# Patient Record
Sex: Female | Born: 1975 | Race: Black or African American | Hispanic: No | Marital: Single | Smoking: Never smoker
Health system: Southern US, Community
[De-identification: ages and names within clinical notes are randomized; demographics above are authoritative.]

## PROBLEM LIST (undated history)

## (undated) DIAGNOSIS — G43909 Migraine, unspecified, not intractable, without status migrainosus: Secondary | ICD-10-CM

## (undated) HISTORY — PX: TUBAL LIGATION: SHX77

---

## 2020-05-12 ENCOUNTER — Emergency Department (HOSPITAL_COMMUNITY)
Admission: EM | Admit: 2020-05-12 | Discharge: 2020-05-12 | Disposition: A | Discharge status: Home / Self Care | Payer: Medicaid - Out of State | Attending: Emergency Medicine | Admitting: Emergency Medicine

## 2020-05-12 ENCOUNTER — Other Ambulatory Visit: Payer: Self-pay

## 2020-05-12 ENCOUNTER — Encounter (HOSPITAL_COMMUNITY): Payer: Self-pay | Admitting: *Deleted

## 2020-05-12 DIAGNOSIS — M545 Low back pain: Secondary | ICD-10-CM | POA: Diagnosis not present

## 2020-05-12 DIAGNOSIS — M549 Dorsalgia, unspecified: Secondary | ICD-10-CM

## 2020-05-12 HISTORY — DX: Migraine, unspecified, not intractable, without status migrainosus: G43.909

## 2020-05-12 MED ORDER — LIDOCAINE 5 % EX PTCH: 1.0000 | MEDICATED_PATCH | CUTANEOUS | 0 refills | Status: DC

## 2020-05-12 MED ORDER — CELECOXIB 100 MG PO CAPS
100.0000 mg | ORAL_CAPSULE | Freq: Two times a day (BID) | ORAL | 0 refills | Status: AC
Start: 2020-05-12 — End: 2020-05-19

## 2020-05-12 NOTE — ED Triage Notes (Signed)
Pt c/o lower back pain x weeks. Denies known injury. Pt was seen by a doctor and was told it was a pulled muscle and was given muscle relaxers with no relief. Pt ambulatory in triage.

## 2020-05-12 NOTE — ED Provider Notes (Signed)
[facility] EMERGENCY DEPARTMENT Provider Note   CSN: [id number] Arrival date & time: 05/12/20  1258     History Chief Complaint  Patient presents with  . Back Pain    Elizabeth Fox is a 44 y.o. female.  HPI   Patient is a 44 year old female with a history of migraines who presents to the emergency department today complaining of bilateral lower back pain that started a few weeks ago.  States that she does a lot of heavy lifting at work and ended up falling a few weeks ago which also exacerbated her pain.  She has no significant pain at rest but whenever she moves or bends a certain way the pain is worse.  She rates her pain as 10/10.  Pain is improved with a heating pad.  She is also tried muscle relaxers and Flexeril without any significant relief.  She states that she was seen at urgent care recently for this problem and upper respiratory symptoms.  She was told she had a sinus infection and placed on antibiotics.  She was also tested for Covid at that time and it was negative.  Her symptoms persisted so she was seen at another hospital in [place] last week where she had x-rays done and also had a UA completed which were both negative.  She was given a prescription for ibuprofen and muscle relaxers which she states are not improving her symptoms.  She denies any abdominal pain, nausea, vomiting, urinary symptoms.  Denies any chest pain, pleuritic pain.  She has chronic shortness of breath on exertion that is unchanged.  Pt denies any numbness/tingling/weakness to the BLE. Denies saddle anesthesia. Denies loss of control of bowels or bladder. No urinary retention. No fevers. Denies a h/o IVDU. Denies a h/o CA or recent unintended weight loss.  Past Medical History:  Diagnosis Date  . Migraines     There are no problems to display for this patient.   Past Surgical History:  Procedure Laterality Date  . TUBAL LIGATION       OB History   No obstetric history on file.     No  family history on file.  Social History   Tobacco Use  . Smoking status: Never Smoker  . Smokeless tobacco: Never Used  Substance Use Topics  . Alcohol use: Never  . Drug use: Never    Home Medications Prior to Admission medications   Medication Sig Start Date End Date Taking? Authorizing Provider  celecoxib (CELEBREX) 100 MG capsule Take 1 capsule (100 mg total) by mouth 2 (two) times daily for 7 days. 05/12/20 05/19/20  Sofie Schendel S, PA-C  lidocaine (LIDODERM) 5 % Place 1 patch onto the skin daily. Remove & Discard patch within 12 hours or as directed by MD 05/12/20   Artisha Capri, [place], PA-C    Allergies    Patient has no known allergies.  Review of Systems   Review of Systems  Constitutional: Negative for fever.  Respiratory: Negative for shortness of breath.   Cardiovascular: Negative for chest pain.  Gastrointestinal: Negative for abdominal pain, constipation, diarrhea, nausea and vomiting.  Genitourinary: Negative for dysuria and hematuria.       No loss of control of bowel or bladder function  Musculoskeletal: Positive for back pain.  Skin: Negative for rash.  Neurological: Negative for weakness and numbness.  All other systems reviewed and are negative.   Physical Exam Updated Vital Signs BP 120/71 (BP Location: Right Arm)   Pulse 87  Temp 99.1 F (37.3 C) (Oral)   Ht 5\' 6"  (1.676 m)   Wt 90.3 kg   SpO2 100%   BMI 32.12 kg/m   Physical Exam Vitals and nursing note reviewed.  Constitutional:      General: She is not in acute distress.    Appearance: She is well-developed.  HENT:     Head: Normocephalic and atraumatic.  Eyes:     Conjunctiva/sclera: Conjunctivae normal.  Cardiovascular:     Rate and Rhythm: Normal rate and regular rhythm.     Heart sounds: Normal heart sounds. No murmur.  Pulmonary:     Effort: Pulmonary effort is normal. No respiratory distress.     Breath sounds: Normal breath sounds. No wheezing, rhonchi or rales.  Abdominal:      Palpations: Abdomen is soft.     Tenderness: There is no abdominal tenderness. There is no right CVA tenderness or left CVA tenderness.  Musculoskeletal:     Cervical back: Neck supple.     Comments: TTP noted to the midline lumbar spine as well as to the bilateral lumbar paraspinous muscles that reproduces her pain.  She had 5/5 strength noted to the bilateral lower extremities with normal sensation throughout.  Ambulatory with a slight limp but steady gait.  Skin:    General: Skin is warm and dry.  Neurological:     Mental Status: She is alert.     ED Results / Procedures / Treatments   Labs (all labs ordered are listed, but only abnormal results are displayed) Labs Reviewed - No data to display  EKG None  Radiology No results found.  Procedures Procedures (including critical care time)  Medications Ordered in ED Medications - No data to display  ED Course  I have reviewed the triage vital signs and the nursing notes.  Pertinent labs & imaging results that were available during my care of the patient were reviewed by me and considered in my medical decision making (see chart for details).    MDM Rules/Calculators/A&P                      Patient with back pain.  No neurological deficits and normal neuro exam.  Patient can walk but states is painful.  No loss of bowel or bladder control.  No concern for cauda equina.  No fever, night sweats, weight loss, h/o cancer, IVDU.  RICE protocol and pain medicine indicated and discussed with patient. F/u given. Advised on return precautions. She voices understanding of the plan and reasons to return. All questions answered, pt stable for discharge.    Final Clinical Impression(s) / ED Diagnoses Final diagnoses:  Acute back pain, unspecified back location, unspecified back pain laterality    Rx / DC Orders ED Discharge Orders         Ordered    celecoxib (CELEBREX) 100 MG capsule  2 times daily     05/12/20 1351     lidocaine (LIDODERM) 5 %  Every 24 hours     05/12/20 1351           Rehan Holness S, PA-C 05/12/20 1351    05/14/20, MD 05/12/20 2111

## 2020-05-12 NOTE — Discharge Instructions (Signed)
Take celebrex as directed.   Do not take other NSAIDs while taking Celebrex such as (Aleve, Naprosyn, Aspirin, Ibuprofen, etc) and do not take more than the prescribed dose as this can lead to ulcers and bleeding in your GI tract. You may use warm and cold compresses to help with your symptoms.   You were also given a prescription for Lidoderm patches.  Please use as directed.  Please follow up with your primary doctor within the next 7-10 days for re-evaluation and further treatment of your symptoms.  Return to the emergency department immediately if you experience any back pain associated with fevers, loss of control of your bowels/bladder, weakness/numbness to your legs, numbness to your groin area, inability to walk, or inability to urinate.

## 2020-08-18 ENCOUNTER — Other Ambulatory Visit: Payer: Self-pay

## 2020-08-18 ENCOUNTER — Emergency Department (HOSPITAL_COMMUNITY)
Admission: EM | Admit: 2020-08-18 | Discharge: 2020-08-18 | Disposition: A | Discharge status: Home / Self Care | Payer: BC Managed Care – PPO | Attending: Emergency Medicine | Admitting: Emergency Medicine

## 2020-08-18 ENCOUNTER — Emergency Department (HOSPITAL_COMMUNITY): Payer: BC Managed Care – PPO

## 2020-08-18 ENCOUNTER — Encounter (HOSPITAL_COMMUNITY): Payer: Self-pay | Admitting: *Deleted

## 2020-08-18 DIAGNOSIS — R1030 Lower abdominal pain, unspecified: Secondary | ICD-10-CM | POA: Diagnosis present

## 2020-08-18 DIAGNOSIS — R112 Nausea with vomiting, unspecified: Secondary | ICD-10-CM | POA: Diagnosis not present

## 2020-08-18 LAB — CBC WITH DIFFERENTIAL/PLATELET
Abs Immature Granulocytes: 0.02 10*3/uL (ref 0.00–0.07)
Basophils Absolute: 0 10*3/uL (ref 0.0–0.1)
Basophils Relative: 1 %
Eosinophils Absolute: 0 10*3/uL (ref 0.0–0.5)
Eosinophils Relative: 0 %
HCT: 45 % (ref 36.0–46.0)
Hemoglobin: 14.8 g/dL (ref 12.0–15.0)
Immature Granulocytes: 0 %
Lymphocytes Relative: 31 %
Lymphs Abs: 2.7 10*3/uL (ref 0.7–4.0)
MCH: 30 pg (ref 26.0–34.0)
MCHC: 32.9 g/dL (ref 30.0–36.0)
MCV: 91.3 fL (ref 80.0–100.0)
Monocytes Absolute: 0.4 10*3/uL (ref 0.1–1.0)
Monocytes Relative: 5 %
Neutro Abs: 5.3 10*3/uL (ref 1.7–7.7)
Neutrophils Relative %: 63 %
Platelets: 251 10*3/uL (ref 150–400)
RBC: 4.93 MIL/uL (ref 3.87–5.11)
RDW: 13 % (ref 11.5–15.5)
WBC: 8.5 10*3/uL (ref 4.0–10.5)
nRBC: 0 % (ref 0.0–0.2)

## 2020-08-18 LAB — COMPREHENSIVE METABOLIC PANEL
ALT: 19 U/L (ref 0–44)
AST: 15 U/L (ref 15–41)
Albumin: 4.2 g/dL (ref 3.5–5.0)
Alkaline Phosphatase: 95 U/L (ref 38–126)
Anion gap: 11 (ref 5–15)
BUN: 12 mg/dL (ref 6–20)
CO2: 22 mmol/L (ref 22–32)
Calcium: 8.9 mg/dL (ref 8.9–10.3)
Chloride: 107 mmol/L (ref 98–111)
Creatinine, Ser: 0.73 mg/dL (ref 0.44–1.00)
GFR calc Af Amer: >60 mL/min (ref >60)
GFR calc non Af Amer: >60 mL/min (ref >60)
Glucose, Bld: 136 mg/dL — ABNORMAL HIGH (ref 70–99)
Potassium: 3.3 mmol/L — ABNORMAL LOW (ref 3.5–5.1)
Sodium: 140 mmol/L (ref 135–145)
Total Bilirubin: 0.5 mg/dL (ref 0.3–1.2)
Total Protein: 7.6 g/dL (ref 6.5–8.1)

## 2020-08-18 LAB — URINALYSIS, ROUTINE W REFLEX MICROSCOPIC
Bacteria, UA: NONE SEEN
Bilirubin Urine: NEGATIVE
Glucose, UA: NEGATIVE mg/dL
Ketones, ur: NEGATIVE mg/dL
Leukocytes,Ua: NEGATIVE
Nitrite: NEGATIVE
Protein, ur: NEGATIVE mg/dL
Specific Gravity, Urine: 1.023 (ref 1.005–1.030)
pH: 5 (ref 5.0–8.0)

## 2020-08-18 LAB — HCG, QUANTITATIVE, PREGNANCY: hCG, Beta Chain, Quant, S: <1 m[IU]/mL (ref <5)

## 2020-08-18 LAB — LIPASE, BLOOD: Lipase: 27 U/L (ref 11–51)

## 2020-08-18 MED ORDER — DICYCLOMINE HCL 20 MG PO TABS: 20.0000 mg | ORAL_TABLET | Freq: Two times a day (BID) | ORAL | 0 refills | Status: DC

## 2020-08-18 MED ORDER — ONDANSETRON 4 MG PO TBDP
4.0000 mg | ORAL_TABLET | Freq: Three times a day (TID) | ORAL | 0 refills | Status: AC | PRN
Start: 2020-08-18 — End: ?

## 2020-08-18 MED ORDER — IOHEXOL 300 MG/ML  SOLN
100.0000 mL | Freq: Once | INTRAMUSCULAR | Status: AC | PRN
  Administered 2020-08-18: 100 mL via INTRAVENOUS

## 2020-08-18 NOTE — ED Triage Notes (Signed)
Pt c/o lower abd pain that radiates to back area for the past month, admits to n/v with the pain along with constipation.

## 2020-08-18 NOTE — ED Provider Notes (Signed)
Elizabeth Fox   CSN: [id number] Arrival date & time: 08/18/20  1304     History Chief Complaint  Patient presents with  . Abdominal Pain    Elizabeth Fox is a 44 y.o. female presenting for evaluation of intermittent lower abdominal pain which radiates to her lower back which is been present since the beginning of July since she was evaluated for a suspected viral gastritis.  Since this time she has had increasing problems with constipation but also pain symptoms associated with nausea with occasional emesis.  She has seen her PCP for this and it was suspected that she had possible IBS.  She has been on Linzess which has not improved her symptoms.  She describes sharp sometimes cramping pain randomly occurring across her lower abdomen, sometimes fleeting, sometimes more long-lasting.  She is currently symptom-free.  She has had no fevers or chills, denies dysuria, vaginal discharge or pain.  No hematuria.  The history is provided by the patient.       Past Medical History:  Diagnosis Date  . Migraines     There are no problems to display for this patient.   Past Surgical History:  Procedure Laterality Date  . TUBAL LIGATION       OB History   No obstetric history on file.     No family history on file.  Social History   Tobacco Use  . Smoking status: Never Smoker  . Smokeless tobacco: Never Used  Vaping Use  . Vaping Use: Never used  Substance Use Topics  . Alcohol use: Never  . Drug use: Never    Home Medications Prior to Admission medications   Medication Sig Start Date End Date Taking? Authorizing Provider  dicyclomine (BENTYL) 20 MG tablet Take 1 tablet (20 mg total) by mouth 2 (two) times daily. 08/18/20   Burgess Amor, PA-C  lidocaine (LIDODERM) 5 % Place 1 patch onto the skin daily. Remove & Discard patch within 12 hours or as directed by MD 05/12/20   Couture, Cortni S, PA-C  ondansetron (ZOFRAN ODT) 4 MG disintegrating  tablet Take 1 tablet (4 mg total) by mouth every 8 (eight) hours as needed for nausea or vomiting. 08/18/20   Burgess Amor, PA-C    Allergies    Patient has no known allergies.  Review of Systems   Review of Systems  Constitutional: Negative for chills and fever.  HENT: Negative for congestion and sore throat.   Eyes: Negative.   Respiratory: Negative for chest tightness and shortness of breath.   Cardiovascular: Negative for chest pain.  Gastrointestinal: Positive for abdominal pain and constipation. Negative for diarrhea, nausea and vomiting.  Genitourinary: Negative.  Negative for dysuria, hematuria, vaginal discharge and vaginal pain.  Musculoskeletal: Negative for arthralgias, joint swelling and neck pain.  Skin: Negative.  Negative for rash and wound.  Neurological: Negative for dizziness, weakness, light-headedness, numbness and headaches.  Psychiatric/Behavioral: Negative.     Physical Exam Updated Vital Signs BP 132/78 (BP Location: Right Arm)   Pulse 64   Temp 98.5 F (36.9 C) (Oral)   Resp 17   Ht 5\' 7"  (1.702 m)   Wt 89.8 kg   SpO2 100%   BMI 31.01 kg/m   Physical Exam Vitals and nursing Fox reviewed.  Constitutional:      Appearance: She is well-developed.  HENT:     Head: Normocephalic and atraumatic.  Eyes:     Conjunctiva/sclera: Conjunctivae normal.  Cardiovascular:  Rate and Rhythm: Normal rate and regular rhythm.     Heart sounds: Normal heart sounds.  Pulmonary:     Effort: Pulmonary effort is normal.     Breath sounds: Normal breath sounds. No wheezing.  Abdominal:     General: Bowel sounds are normal. There is distension.     Palpations: Abdomen is soft.     Tenderness: There is abdominal tenderness in the suprapubic area.     Hernia: No hernia is present.     Comments: Increased tympany lower abdomen.    Musculoskeletal:        General: Normal range of motion.     Cervical back: Normal range of motion.  Skin:    General: Skin is warm  and dry.  Neurological:     Mental Status: She is alert.     ED Results / Procedures / Treatments   Labs (all labs ordered are listed, but only abnormal results are displayed) Labs Reviewed  COMPREHENSIVE METABOLIC PANEL - Abnormal; Notable for the following components:      Result Value   Potassium 3.3 (*)    Glucose, Bld 136 (*)    All other components within normal limits  URINALYSIS, ROUTINE W REFLEX MICROSCOPIC - Abnormal; Notable for the following components:   Hgb urine dipstick SMALL (*)    All other components within normal limits  CBC WITH DIFFERENTIAL/PLATELET  LIPASE, BLOOD  HCG, QUANTITATIVE, PREGNANCY    EKG None  Radiology CT ABDOMEN PELVIS W CONTRAST  Result Date: 08/18/2020 CLINICAL DATA:  Abdominal distension and pain for several weeks, initial encounter EXAM: CT ABDOMEN AND PELVIS WITH CONTRAST TECHNIQUE: Multidetector CT imaging of the abdomen and pelvis was performed using the standard protocol following bolus administration of intravenous contrast. CONTRAST:  OMNIPAQUE IOHEXOL 300 MG/ML  SOLN COMPARISON:  None. FINDINGS: Lower chest: No acute abnormality. Hepatobiliary: No focal liver abnormality is seen. No gallstones, gallbladder wall thickening, or biliary dilatation. Pancreas: Unremarkable. No pancreatic ductal dilatation or surrounding inflammatory changes. Spleen: Normal in size without focal abnormality. Adrenals/Urinary Tract: The adrenal glands are within normal limits. Kidneys are well visualized bilaterally. Normal enhancement pattern is seen. Normal excretion of contrast is noted bilaterally. No obstructive changes are noted. No renal calculi are seen. The bladder is partially distended. Stomach/Bowel: The appendix is within normal limits. No obstructive or inflammatory changes of colon are seen. Small bowel and stomach appear within normal limits. Vascular/Lymphatic: No significant vascular findings are present. No enlarged abdominal or pelvic  lymph nodes. Reproductive: Uterus and bilateral adnexa are unremarkable. Other: No abdominal wall hernia or abnormality. No abdominopelvic ascites. Musculoskeletal: Degenerative changes of lumbar spine are noted. IMPRESSION: No acute abnormality noted. Electronically Signed   By: Alcide Clever M.D.   On: 08/18/2020 18:38    Procedures Procedures (including critical care time)  Medications Ordered in ED Medications  iohexol (OMNIPAQUE) 300 MG/ML solution 100 mL (100 mLs Intravenous Contrast Given 08/18/20 1819)    ED Course  I have reviewed the triage vital signs and the nursing notes.  Pertinent labs & imaging results that were available during my care of the patient were reviewed by me and considered in my medical decision making (see chart for details).    MDM Rules/Calculators/A&P                          Pt with intermittent now chronic lower abdominal pain, some constipation noted not improved with Linzess. Labs and  imaging reviewed and discussed. Reassurance given.  She was advised to add miralax to her daily routine for constipation improvement, may consider holding the Linzess since she had no improvement with this.  zofran prescribed,  Also prescribed bentyl for cramping but urged use sparingly as can increase constipation.  Referral to GI for f/u care prn.  The patient appears reasonably screened and/or stabilized for discharge and I doubt any other medical condition or other [facility] requiring further screening, evaluation, or treatment in the ED at this time prior to discharge.  Final Clinical Impression(s) / ED Diagnoses Final diagnoses:  Lower abdominal pain  Non-intractable vomiting with nausea, unspecified vomiting type    Rx / DC Orders ED Discharge Orders         Ordered    ondansetron (ZOFRAN ODT) 4 MG disintegrating tablet  Every 8 hours PRN        08/18/20 1941    dicyclomine (BENTYL) 20 MG tablet  2 times daily        08/18/20 1941           Burgess Amor,  Cordelia Poche 08/19/20 1307    Derwood Kaplan, MD 08/19/20 1555

## 2020-08-18 NOTE — Discharge Instructions (Addendum)
Your lab tests and CT imaging today are negative for any abnormalities which would explain the source of your symptoms.  You may need further testing by a gastroenterologist as discussed.  See the referral above.  In the interim you may try the medications prescribed for nausea and abdominal pain.

## 2020-08-23 ENCOUNTER — Other Ambulatory Visit: Payer: Self-pay

## 2020-08-23 ENCOUNTER — Encounter (INDEPENDENT_AMBULATORY_CARE_PROVIDER_SITE_OTHER): Payer: Self-pay | Admitting: *Deleted

## 2020-08-23 ENCOUNTER — Other Ambulatory Visit (INDEPENDENT_AMBULATORY_CARE_PROVIDER_SITE_OTHER): Payer: Self-pay | Admitting: *Deleted

## 2020-08-23 ENCOUNTER — Encounter (INDEPENDENT_AMBULATORY_CARE_PROVIDER_SITE_OTHER): Payer: Self-pay | Admitting: Gastroenterology

## 2020-08-23 ENCOUNTER — Ambulatory Visit (INDEPENDENT_AMBULATORY_CARE_PROVIDER_SITE_OTHER): Payer: BC Managed Care – PPO | Admitting: Gastroenterology

## 2020-08-23 VITALS — BP 115/79 | HR 74 | Temp 99.1°F | Ht 67.0 in | Wt 199.5 lb

## 2020-08-23 DIAGNOSIS — R1084 Generalized abdominal pain: Secondary | ICD-10-CM

## 2020-08-23 DIAGNOSIS — R110 Nausea: Secondary | ICD-10-CM

## 2020-08-23 DIAGNOSIS — K5909 Other constipation: Secondary | ICD-10-CM

## 2020-08-23 MED ORDER — PANTOPRAZOLE SODIUM 40 MG PO TBEC
40.0000 mg | DELAYED_RELEASE_TABLET | Freq: Two times a day (BID) | ORAL | 3 refills | Status: DC
Start: 2020-08-23 — End: 2020-09-08

## 2020-08-23 MED ORDER — LINACLOTIDE 290 MCG PO CAPS: 290.0000 ug | ORAL_CAPSULE | Freq: Every day | ORAL | 3 refills | Status: DC

## 2020-08-23 NOTE — H&P (View-Only) (Signed)
Patient profile: Elizabeth Fox is a 44 y.o. female seen for evaluation of ER f/up (seen at [facility] 08/18/20 and [facility] 08/11/20).  History of Present Illness: Elizabeth Fox is seen today for ER follow-up, she was seen for evaluation of lower abdominal pain and constipation.  She is seen today for 6 weeks of severe abdominal pain.  She has been in the emergency room twice, once [facility] and once at [facility].  Reports prior to 6 weeks ago having chronic constipation that was fairly well controlled with Linzess (unsure of dose).  She more recently over the past 6 weeks describes diffuse abdominal pain on over her entire abdomen radiating into her back.  She does not feel that it changes significantly with eating.  It is associated with nausea and early satiety.  Feels she is eating a small amount and feels full.  Occasional vomiting and headaches.  She denies any dysphagia.  She was put on Pepcid at one ER visit without significant improvement.  She feels that the Linzess she is on is no longer working but is unsure of the dose, she is typically having 3 to 4 days between bowel movements.  She denies any alternating diarrhea or blood in stool. States her abdominal pain eases a little bit after bowel movement but does not completely resolve.  She has been given dicyclomine without improvement after ER visit.  She also reports trying some hydrocodone without improvement as well.   She reports at [facility] she was also treated for possible sinus infection with antibiotics that did not change her GI symptoms.  She is a non-smoker, no alcohol.  Denies frequent NSAID use.  Wt Readings from Last 3 Encounters:  08/23/20 199 lb 8 oz (90.5 kg)  08/18/20 198 lb (89.8 kg)  05/12/20 199 lb (90.3 kg)     Last Colonoscopy: none prior Last Endoscopy: none prior    Past Medical History:  Past Medical History:  Diagnosis Date  . Migraines     Problem List: There are no problems to display for this patient.    Past Surgical History: Past Surgical History:  Procedure Laterality Date  . TUBAL LIGATION      Allergies: No Known Allergies    Home Medications:  Current Outpatient Medications:  .  ALPRAZolam (XANAX) 1 MG tablet, Take by mouth., Disp: , Rfl:  .  dicyclomine (BENTYL) 20 MG tablet, Take 1 tablet (20 mg total) by mouth 2 (two) times daily., Disp: 20 tablet, Rfl: 0 .  ondansetron (ZOFRAN ODT) 4 MG disintegrating tablet, Take 1 tablet (4 mg total) by mouth every 8 (eight) hours as needed for nausea or vomiting., Disp: 20 tablet, Rfl: 0 .  lidocaine (LIDODERM) 5 %, Place 1 patch onto the skin daily. Remove & Discard patch within 12 hours or as directed by MD (Patient not taking: Reported on 08/23/2020), Disp: 30 patch, Rfl: 0 .  linaclotide (LINZESS) 290 MCG CAPS capsule, Take 1 capsule (290 mcg total) by mouth daily before breakfast., Disp: 30 capsule, Rfl: 3 .  pantoprazole (PROTONIX) 40 MG tablet, Take 1 tablet (40 mg total) by mouth 2 (two) times daily before a meal., Disp: 60 tablet, Rfl: 3   Family History: family history includes Diabetes in her mother.  Denies any family GI history of colon polyps colon cancer.  Social History:    reports that she has never smoked. She has never used smokeless tobacco. She reports that she does not drink alcohol and does not use drugs.  Review of Systems: Constitutional: Denies weight loss/weight gain  Eyes: No changes in vision. ENT: No oral lesions, sore throat.  GI: see HPI.  Heme/Lymph: No easy bruising.  CV: No chest pain.  GU: No hematuria.  Integumentary: No rashes.  Neuro: No headaches.  Psych: No depression/anxiety.  Endocrine: No heat/cold intolerance.  Allergic/Immunologic: No urticaria.  Resp: No cough, SOB.  Musculoskeletal: No joint swelling.    Physical Examination: BP 115/79 (BP Location: Left Arm, Patient Position: Sitting, Cuff Size: Normal)   Pulse 74   Temp 99.1 F (37.3 C) (Oral)   Ht 5\' 7"  (1.702 m)   Wt  199 lb 8 oz (90.5 kg)   BMI 31.25 kg/m  Gen: NAD, alert and oriented x 4 HEENT: PEERLA, EOMI, Neck: supple, no JVD Chest: CTA bilaterally, no wheezes, crackles, or other adventitious sounds CV: RRR, no m/g/c/r Abd: soft, diffuse abdominal tenderness worse in epigastric area, ND, +BS in all four quadrants; no HSM, guarding, ridigity, or rebound tenderness Ext: no edema, well perfused with 2+ pulses, Skin: no rash or lesions noted on observed skin Lymph: no noted LAD  Data:  08/18/20-CT a/p IMPRESSION: No acute abnormality noted.  August 16, 2020-CBC unremarkable, CMP with potassium 3.3, Lipase normal.  COVID negative 08/11/20   Assessment/Plan: Ms. Fox is a 44 y.o. female  Elizabeth was seen today for new patient (initial visit).  Diagnoses and all orders for this visit:  Nausea without vomiting  Diffuse abdominal pain  Chronic constipation  Other orders -     pantoprazole (PROTONIX) 40 MG tablet; Take 1 tablet (40 mg total) by mouth 2 (two) times daily before a meal. -     linaclotide (LINZESS) 290 MCG CAPS capsule; Take 1 capsule (290 mcg total) by mouth daily before breakfast.     1.  Epigastric pain/nausea-associated with nausea and early satiety despite Pepcid.  She had a normal CT scan and labs except from mild hypokalemia.  We will start her on a PPI in case gastritis or peptic ulcer disease is contributing.  We will schedule endoscopy for evaluation.  Has Zofran to use as needed for nausea  2.  Diffuse abdominal pain associated with constipation-on unknown dose of Linzess prior (possibly 55), will try 290 mcg.  Adequate fluid encouraged.  We will schedule colonoscopy for evaluation which she has never had in the past.  She denies any improvement with dicyclomine or Lortab.   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 Completed to limit physical activity at  work while ongoing work-up.  Patient denies CP, SOB, and use of blood thinners. I discussed the risks and benefits of procedure including bleeding, perforation, infection, missed lesions, medication reactions and possible hospitalization or surgery if complications. All questions answered.     I personally performed the service, non-incident to. (WP)  , [facility]

## 2020-08-23 NOTE — Patient Instructions (Addendum)
Limit fatty greasy foods.  We are starting Protonix for treatment of a possible stomach ulcer.  We are scheduling endoscopy colonoscopy for evaluation.

## 2020-08-23 NOTE — Telephone Encounter (Signed)
This encounter was created in error - please disregard.

## 2020-08-23 NOTE — Progress Notes (Signed)
 Patient profile: Elizabeth Fox is a 43 y.o. female seen for evaluation of ER f/up (seen at [facility] 08/18/20 and [facility] 08/11/20).  History of Present Illness: Elizabeth Fox is seen today for ER follow-up, she was seen for evaluation of lower abdominal pain and constipation.  She is seen today for 6 weeks of severe abdominal pain.  She has been in the emergency room twice, once [facility] and once at [facility].  Reports prior to 6 weeks ago having chronic constipation that was fairly well controlled with Linzess (unsure of dose).  She more recently over the past 6 weeks describes diffuse abdominal pain on over her entire abdomen radiating into her back.  She does not feel that it changes significantly with eating.  It is associated with nausea and early satiety.  Feels she is eating a small amount and feels full.  Occasional vomiting and headaches.  She denies any dysphagia.  She was put on Pepcid at one ER visit without significant improvement.  She feels that the Linzess she is on is no longer working but is unsure of the dose, she is typically having 3 to 4 days between bowel movements.  She denies any alternating diarrhea or blood in stool. States her abdominal pain eases a little bit after bowel movement but does not completely resolve.  She has been given dicyclomine without improvement after ER visit.  She also reports trying some hydrocodone without improvement as well.   She reports at [facility] she was also treated for possible sinus infection with antibiotics that did not change her GI symptoms.  She is a non-smoker, no alcohol.  Denies frequent NSAID use.  Wt Readings from Last 3 Encounters:  08/23/20 199 lb 8 oz (90.5 kg)  08/18/20 198 lb (89.8 kg)  05/12/20 199 lb (90.3 kg)     Last Colonoscopy: none prior Last Endoscopy: none prior    Past Medical History:  Past Medical History:  Diagnosis Date  . Migraines     Problem List: There are no problems to display for this patient.    Past Surgical History: Past Surgical History:  Procedure Laterality Date  . TUBAL LIGATION      Allergies: No Known Allergies    Home Medications:  Current Outpatient Medications:  .  ALPRAZolam (XANAX) 1 MG tablet, Take by mouth., Disp: , Rfl:  .  dicyclomine (BENTYL) 20 MG tablet, Take 1 tablet (20 mg total) by mouth 2 (two) times daily., Disp: 20 tablet, Rfl: 0 .  ondansetron (ZOFRAN ODT) 4 MG disintegrating tablet, Take 1 tablet (4 mg total) by mouth every 8 (eight) hours as needed for nausea or vomiting., Disp: 20 tablet, Rfl: 0 .  lidocaine (LIDODERM) 5 %, Place 1 patch onto the skin daily. Remove & Discard patch within 12 hours or as directed by MD (Patient not taking: Reported on 08/23/2020), Disp: 30 patch, Rfl: 0 .  linaclotide (LINZESS) 290 MCG CAPS capsule, Take 1 capsule (290 mcg total) by mouth daily before breakfast., Disp: 30 capsule, Rfl: 3 .  pantoprazole (PROTONIX) 40 MG tablet, Take 1 tablet (40 mg total) by mouth 2 (two) times daily before a meal., Disp: 60 tablet, Rfl: 3   Family History: family history includes Diabetes in her mother.  Denies any family GI history of colon polyps colon cancer.  Social History:    reports that she has never smoked. She has never used smokeless tobacco. She reports that she does not drink alcohol and does not use drugs.     Review of Systems: Constitutional: Denies weight loss/weight gain  Eyes: No changes in vision. ENT: No oral lesions, sore throat.  GI: see HPI.  Heme/Lymph: No easy bruising.  CV: No chest pain.  GU: No hematuria.  Integumentary: No rashes.  Neuro: No headaches.  Psych: No depression/anxiety.  Endocrine: No heat/cold intolerance.  Allergic/Immunologic: No urticaria.  Resp: No cough, SOB.  Musculoskeletal: No joint swelling.    Physical Examination: BP 115/79 (BP Location: Left Arm, Patient Position: Sitting, Cuff Size: Normal)   Pulse 74   Temp 99.1 F (37.3 C) (Oral)   Ht 5\' 7"  (1.702 m)   Wt  199 lb 8 oz (90.5 kg)   BMI 31.25 kg/m  Gen: NAD, alert and oriented x 4 HEENT: PEERLA, EOMI, Neck: supple, no JVD Chest: CTA bilaterally, no wheezes, crackles, or other adventitious sounds CV: RRR, no m/g/c/r Abd: soft, diffuse abdominal tenderness worse in epigastric area, ND, +BS in all four quadrants; no HSM, guarding, ridigity, or rebound tenderness Ext: no edema, well perfused with 2+ pulses, Skin: no rash or lesions noted on observed skin Lymph: no noted LAD  Data:  08/18/20-CT a/p IMPRESSION: No acute abnormality noted.  August 16, 2020-CBC unremarkable, CMP with potassium 3.3, Lipase normal.  COVID negative 08/11/20   Assessment/Plan: [name] is a 44 y.o. female  [name] was seen today for new patient (initial visit).  Diagnoses and all orders for this visit:  Nausea without vomiting  Diffuse abdominal pain  Chronic constipation  Other orders -     pantoprazole (PROTONIX) 40 MG tablet; Take 1 tablet (40 mg total) by mouth 2 (two) times daily before a meal. -     linaclotide (LINZESS) 290 MCG CAPS capsule; Take 1 capsule (290 mcg total) by mouth daily before breakfast.     1.  Epigastric pain/nausea-associated with nausea and early satiety despite Pepcid.  She had a normal CT scan and labs except from mild hypokalemia.  We will start her on a PPI in case gastritis or peptic ulcer disease is contributing.  We will schedule endoscopy for evaluation.  Has Zofran to use as needed for nausea  2.  Diffuse abdominal pain associated with constipation-on unknown dose of Linzess prior (possibly 55), will try 290 mcg.  Adequate fluid encouraged.  We will schedule colonoscopy for evaluation which she has never had in the past.  She denies any improvement with dicyclomine or Lortab.   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 Completed to limit physical activity at  work while ongoing work-up.  Patient denies CP, SOB, and use of blood thinners. I discussed the risks and benefits of procedure including bleeding, perforation, infection, missed lesions, medication reactions and possible hospitalization or surgery if complications. All questions answered.     I personally performed the service, non-incident to. (WP)  , [facility]

## 2020-08-27 ENCOUNTER — Other Ambulatory Visit (HOSPITAL_COMMUNITY)
Admission: RE | Admit: 2020-08-27 | Discharge: 2020-08-27 | Disposition: A | Discharge status: Home / Self Care | Payer: BC Managed Care – PPO | Source: Ambulatory Visit | Attending: Gastroenterology | Admitting: Gastroenterology

## 2020-08-27 ENCOUNTER — Other Ambulatory Visit: Payer: Self-pay

## 2020-08-27 DIAGNOSIS — Z20822 Contact with and (suspected) exposure to covid-19: Secondary | ICD-10-CM | POA: Diagnosis not present

## 2020-08-27 DIAGNOSIS — Z01812 Encounter for preprocedural laboratory examination: Secondary | ICD-10-CM | POA: Insufficient documentation

## 2020-08-28 LAB — SARS CORONAVIRUS 2 (TAT 6-24 HRS): SARS Coronavirus 2: NEGATIVE

## 2020-09-01 ENCOUNTER — Ambulatory Visit (HOSPITAL_COMMUNITY)
Admission: RE | Admit: 2020-09-01 | Discharge: 2020-09-01 | Disposition: A | Discharge status: Home / Self Care | Payer: BC Managed Care – PPO | Attending: Gastroenterology | Admitting: Gastroenterology

## 2020-09-01 ENCOUNTER — Ambulatory Visit (HOSPITAL_COMMUNITY): Payer: BC Managed Care – PPO | Admitting: Anesthesiology

## 2020-09-01 ENCOUNTER — Encounter (HOSPITAL_COMMUNITY): Payer: Self-pay | Admitting: Gastroenterology

## 2020-09-01 ENCOUNTER — Encounter (HOSPITAL_COMMUNITY)
Admission: RE | Disposition: A | Discharge status: Home / Self Care | Payer: Self-pay | Source: Home / Self Care | Attending: Gastroenterology

## 2020-09-01 ENCOUNTER — Other Ambulatory Visit: Payer: Self-pay

## 2020-09-01 DIAGNOSIS — K648 Other hemorrhoids: Secondary | ICD-10-CM | POA: Insufficient documentation

## 2020-09-01 DIAGNOSIS — K581 Irritable bowel syndrome with constipation: Secondary | ICD-10-CM | POA: Diagnosis not present

## 2020-09-01 DIAGNOSIS — R1013 Epigastric pain: Secondary | ICD-10-CM | POA: Diagnosis not present

## 2020-09-01 DIAGNOSIS — K449 Diaphragmatic hernia without obstruction or gangrene: Secondary | ICD-10-CM | POA: Diagnosis not present

## 2020-09-01 DIAGNOSIS — K5909 Other constipation: Secondary | ICD-10-CM | POA: Diagnosis present

## 2020-09-01 DIAGNOSIS — Z79899 Other long term (current) drug therapy: Secondary | ICD-10-CM | POA: Diagnosis not present

## 2020-09-01 DIAGNOSIS — K6289 Other specified diseases of anus and rectum: Secondary | ICD-10-CM | POA: Diagnosis not present

## 2020-09-01 HISTORY — PX: ESOPHAGOGASTRODUODENOSCOPY (EGD) WITH PROPOFOL: SHX5813

## 2020-09-01 HISTORY — PX: BIOPSY: SHX5522

## 2020-09-01 HISTORY — PX: COLONOSCOPY WITH PROPOFOL: SHX5780

## 2020-09-01 SURGERY — COLONOSCOPY WITH PROPOFOL
Anesthesia: General

## 2020-09-01 MED ORDER — STERILE WATER FOR IRRIGATION IR SOLN
Status: DC | PRN
  Administered 2020-09-01: 1.5 mL

## 2020-09-01 MED ORDER — GLYCOPYRROLATE 0.2 MG/ML IJ SOLN: 0.2000 mg | Freq: Once | INTRAMUSCULAR | Status: DC

## 2020-09-01 MED ORDER — LACTATED RINGERS IV SOLN: INTRAVENOUS | Status: DC

## 2020-09-01 MED ORDER — LIDOCAINE VISCOUS HCL 2 % MT SOLN
15.0000 mL | Freq: Once | OROMUCOSAL | Status: AC
  Administered 2020-09-01: 15 mL via OROMUCOSAL

## 2020-09-01 MED ORDER — PROPOFOL 10 MG/ML IV BOLUS
INTRAVENOUS | Status: DC | PRN
  Administered 2020-09-01: 50 mg via INTRAVENOUS
  Administered 2020-09-01: 100 mg via INTRAVENOUS
  Administered 2020-09-01 (×8): 50 mg via INTRAVENOUS

## 2020-09-01 MED ORDER — LIDOCAINE VISCOUS HCL 2 % MT SOLN
OROMUCOSAL | Status: AC
  Filled 2020-09-01: qty 15

## 2020-09-01 MED ORDER — GLYCOPYRROLATE 0.2 MG/ML IJ SOLN
INTRAMUSCULAR | Status: AC
  Administered 2020-09-01: 0.2 mg
  Filled 2020-09-01: qty 1

## 2020-09-01 NOTE — Op Note (Addendum)
[facility] Patient Name: Elizabeth Fox Procedure Date: 09/01/2020 9:32 AM MRN: [id number] Date of Birth: 1976/06/14 Attending MD: [name] ,  CSN: [id number] Age: 44 Admit Type: Outpatient Procedure:                Upper GI endoscopy Indications:              Epigastric abdominal pain, bloating Providers:                [name], Jannett Celestine, RN, Sheran Fava, Cyril Mourning, Technician,                            Pandora Leiter, Technician Referring MD:              Medicines:                Monitored Anesthesia Care Complications:            No immediate complications. Estimated Blood Loss:     Estimated blood loss: none. Procedure:                Pre-Anesthesia Assessment:                           - Prior to the procedure, a History and Physical                            was performed, and patient medications, allergies                            and sensitivities were reviewed. The patient's                            tolerance of previous anesthesia was reviewed.                           - The risks and benefits of the procedure and the                            sedation options and risks were discussed with the                            patient. All questions were answered and informed                            consent was obtained.                           After obtaining informed consent, the endoscope was                            passed under direct vision. Throughout the                            procedure, the patient's blood pressure, pulse, and  oxygen saturations were monitored continuously. The                            [id number]-[id number] ([id number]) was introduced through the                            mouth, and advanced to the second part of duodenum.                            The upper GI endoscopy was accomplished without                            difficulty. The patient tolerated  the procedure                            well. Scope In: 9:45:01 AM Scope Out: 9:50:55 AM Total Procedure Duration: 0 hours 5 minutes 54 seconds  Findings:      The Z-line was regular and was found 35 cm from the incisors.      A 1 cm hiatal hernia was present.      The entire examined stomach was normal.      The examined duodenum was normal. Biopsies were taken with a cold       forceps for histology. Impression:               - Z-line regular, 35 cm from the incisors.                           - 1 cm hiatal hernia.                           - Normal stomach.                           - Normal examined duodenum. Biopsied. Moderate Sedation:      Per Anesthesia Care Recommendation:           - Discharge patient to home (ambulatory).                           - Resume previous diet.                           - Await pathology results.                           - Can take FDGard 2-3 times a day for abdominal                            distention/pain.                           - Consider starting a low FODMAP diet.                           - Return to GI clinic as previously scheduled. Procedure Code(s):        --- Professional ---  91638, GC, Esophagogastroduodenoscopy, flexible,                            transoral; with biopsy, single or multiple Diagnosis Code(s):        --- Professional ---                           K44.9, Diaphragmatic hernia without obstruction or                            gangrene                           R10.13, Epigastric pain CPT copyright 2019 [employer]. All rights reserved. The codes documented in this report are preliminary and upon coder review may  be revised to meet current compliance requirements. [name], MD [name],  09/01/2020 10:17:02 AM This report has been signed electronically. Number of Addenda: 0

## 2020-09-01 NOTE — Transfer of Care (Signed)
Immediate Anesthesia Transfer of Care Note  Patient: Elizabeth Fox  Procedure(s) Performed: COLONOSCOPY WITH PROPOFOL (N/A ) ESOPHAGOGASTRODUODENOSCOPY (EGD) WITH PROPOFOL (N/A ) BIOPSY  Patient Location: Endoscopy Unit  Anesthesia Type:MAC  Level of Consciousness: awake, alert , oriented and patient cooperative  Airway & Oxygen Therapy: Patient Spontanous Breathing  Post-op Assessment: Report given to RN and Post -op Vital signs reviewed and stable  Post vital signs: Reviewed and stable  Last Vitals:  Vitals Value Taken Time  BP    Temp    Pulse    Resp    SpO2      Last Pain:  Vitals:   09/01/20 0938  TempSrc:   PainSc: 10-Worst pain ever      Patients Stated Pain Goal: 5 (75/79/72 8206)  Complications: No complications documented.

## 2020-09-01 NOTE — Discharge Instructions (Signed)
Colonoscopy, Adult, Care After This sheet gives you information about how to care for yourself after your procedure. Your doctor may also give you more specific instructions. If you have problems or questions, call your doctor. What can I expect after the procedure? After the procedure, it is common to have:  A small amount of blood in your poop (stool) for 24 hours.  Some gas.  Mild cramping or bloating in your belly (abdomen). Follow these instructions at home: Eating and drinking   Drink enough fluid to keep your pee (urine) pale yellow.  Follow instructions from your doctor about what you cannot eat or drink.  Return to your normal diet as told by your doctor. Avoid heavy or fried foods that are hard to digest. Activity  Rest as told by your doctor.  Do not sit for a long time without moving. Get up to take short walks every 1-2 hours. This is important. Ask for help if you feel weak or unsteady.  Return to your normal activities as told by your doctor. Ask your doctor what activities are safe for you. To help cramping and bloating:   Try walking around.  Put heat on your belly as told by your doctor. Use the heat source that your doctor recommends, such as a moist heat pack or a heating pad. ? Put a towel between your skin and the heat source. ? Leave the heat on for 20-30 minutes. ? Remove the heat if your skin turns bright red. This is very important if you are unable to feel pain, heat, or cold. You may have a greater risk of getting burned. General instructions  For the first 24 hours after the procedure: ? Do not drive or use machinery. ? Do not sign important documents. ? Do not drink alcohol. ? Do your daily activities more slowly than normal. ? Eat foods that are soft and easy to digest.  Take over-the-counter or prescription medicines only as told by your doctor.  Keep all follow-up visits as told by your doctor. This is important. Contact a doctor  if:  You have blood in your poop 2-3 days after the procedure. Get help right away if:  You have more than a small amount of blood in your poop.  You see large clumps of tissue (blood clots) in your poop.  Your belly is swollen.  You feel like you may vomit (nauseous).  You vomit.  You have a fever.  You have belly pain that gets worse, and medicine does not help your pain. Summary  After the procedure, it is common to have a small amount of blood in your poop. You may also have mild cramping and bloating in your belly.  For the first 24 hours after the procedure, do not drive or use machinery, do not sign important documents, and do not drink alcohol.  Get help right away if you have a lot of blood in your poop, feel like you may vomit, have a fever, or have more belly pain. This information is not intended to replace advice given to you by your health care provider. Make sure you discuss any questions you have with your health care provider. Document Revised: 07/07/2019 Document Reviewed: 07/07/2019 Elsevier Patient Education  2020 [employer]. Hemorrhoids Hemorrhoids are swollen veins that may develop:  In the butt (rectum). These are called internal hemorrhoids.  Around the opening of the butt (anus). These are called external hemorrhoids. Hemorrhoids can cause pain, itching, or bleeding. Most of  the time, they do not cause serious problems. They usually get better with diet changes, lifestyle changes, and other home treatments. What are the causes? This condition may be caused by:  Having trouble pooping (constipation).  Pushing hard (straining) to poop.  Watery poop (diarrhea).  Pregnancy.  Being very overweight (obese).  Sitting for long periods of time.  Heavy lifting or other activity that causes you to strain.  Anal sex.  Riding a bike for a long period of time. What are the signs or symptoms? Symptoms of this condition include:  Pain.  Itching  or soreness in the butt.  Bleeding from the butt.  Leaking poop.  Swelling in the area.  One or more lumps around the opening of your butt. How is this diagnosed? A doctor can often diagnose this condition by looking at the affected area. The doctor may also:  Do an exam that involves feeling the area with a gloved hand (digital rectal exam).  Examine the area inside your butt using a small tube (anoscope).  Order blood tests. This may be done if you have lost a lot of blood.  Have you get a test that involves looking inside the colon using a flexible tube with a camera on the end (sigmoidoscopy or colonoscopy). How is this treated? This condition can usually be treated at home. Your doctor may tell you to change what you eat, make lifestyle changes, or try home treatments. If these do not help, procedures can be done to remove the hemorrhoids or make them smaller. These may involve:  Placing rubber bands at the base of the hemorrhoids to cut off their blood supply.  Injecting medicine into the hemorrhoids to shrink them.  Shining a type of light energy onto the hemorrhoids to cause them to fall off.  Doing surgery to remove the hemorrhoids or cut off their blood supply. Follow these instructions at home: Eating and drinking   Eat foods that have a lot of fiber in them. These include whole grains, beans, nuts, fruits, and vegetables.  Ask your doctor about taking products that have added fiber (fibersupplements).  Reduce the amount of fat in your diet. You can do this by: ? Eating low-fat dairy products. ? Eating less red meat. ? Avoiding processed foods.  Drink enough fluid to keep your pee (urine) pale yellow. Managing pain and swelling   Take a warm-water bath (sitz bath) for 20 minutes to ease pain. Do this 3-4 times a day. You may do this in a bathtub or using a portable sitz bath that fits over the toilet.  If told, put ice on the painful area. It may be helpful  to use ice between your warm baths. ? Put ice in a plastic bag. ? Place a towel between your skin and the bag. ? Leave the ice on for 20 minutes, 2-3 times a day. General instructions  Take over-the-counter and prescription medicines only as told by your doctor. ? Medicated creams and medicines may be used as told.  Exercise often. Ask your doctor how much and what kind of exercise is best for you.  Go to the bathroom when you have the urge to poop. Do not wait.  Avoid pushing too hard when you poop.  Keep your butt dry and clean. Use wet toilet paper or moist towelettes after pooping.  Do not sit on the toilet for a long time.  Keep all follow-up visits as told by your doctor. This is important. Contact a  doctor if you:  Have pain and swelling that do not get better with treatment or medicine.  Have trouble pooping.  Cannot poop.  Have pain or swelling outside the area of the hemorrhoids. Get help right away if you have:  Bleeding that will not stop. Summary  Hemorrhoids are swollen veins in the butt or around the opening of the butt.  They can cause pain, itching, or bleeding.  Eat foods that have a lot of fiber in them. These include whole grains, beans, nuts, fruits, and vegetables.  Take a warm-water bath (sitz bath) for 20 minutes to ease pain. Do this 3-4 times a day. This information is not intended to replace advice given to you by your health care provider. Make sure you discuss any questions you have with your health care provider. Document Revised: 12/19/2018 Document Reviewed: 05/02/2018 Elsevier Patient Education  2020 [employer] Upper Endoscopy, Adult, Care After This sheet gives you information about how to care for yourself after your procedure. Your health care provider may also give you more specific instructions. If you have problems or questions, contact your health care provider. What can I expect after the procedure? After the procedure, it is  common to have:  A sore throat.  Mild stomach pain or discomfort.  Bloating.  Nausea. Follow these instructions at home:   Follow instructions from your health care provider about what to eat or drink after your procedure.  Return to your normal activities as told by your health care provider. Ask your health care provider what activities are safe for you.  Take over-the-counter and prescription medicines only as told by your health care provider.  Do not drive for 24 hours if you were given a sedative during your procedure.  Keep all follow-up visits as told by your health care provider. This is important. Contact a health care provider if you have:  A sore throat that lasts longer than one day.  Trouble swallowing. Get help right away if:  You vomit blood or your vomit looks like coffee grounds.  You have: ? A fever. ? Bloody, black, or tarry stools. ? A severe sore throat or you cannot swallow. ? Difficulty breathing. ? Severe pain in your chest or abdomen. Summary  After the procedure, it is common to have a sore throat, mild stomach discomfort, bloating, and nausea.  Do not drive for 24 hours if you were given a sedative during the procedure.  Follow instructions from your health care provider about what to eat or drink after your procedure.  Return to your normal activities as told by your health care provider. This information is not intended to replace advice given to you by your health care provider. Make sure you discuss any questions you have with your health care provider. Document Revised: 06/04/2018 Document Reviewed: 05/13/2018 Elsevier Patient Education  2020 [employer]. Hernia, Adult     A hernia happens when tissue inside your body pushes out through a weak spot in your belly muscles (abdominal wall). This makes a round lump (bulge). The lump may be:  In a scar from surgery that was done in your belly (incisional hernia).  Near your belly  button (umbilical hernia).  In your groin (inguinal hernia). Your groin is the area where your leg meets your lower belly (abdomen). This kind of hernia could also be: ? In your scrotum, if you are female. ? In folds of skin around your vagina, if you are female.  In your upper  thigh (femoral hernia).  Inside your belly (hiatal hernia). This happens when your stomach slides above the muscle between your belly and your chest (diaphragm). If your hernia is small and it does not cause pain, you may not need treatment. If your hernia is large or it causes pain, you may need surgery. Follow these instructions at home: Activity  Avoid stretching or overusing (straining) the muscles near your hernia. Straining can happen when you: ? Lift something heavy. ? Poop (have a bowel movement).  Do not lift anything that is heavier than 10 lb (4.5 kg), or the limit that you are told, until your doctor says that it is safe.  Use the strength of your legs when you lift something heavy. Do not use only your back muscles to lift. General instructions  Do these things if told by your doctor so you do not have trouble pooping (constipation): ? Drink enough fluid to keep your pee (urine) pale yellow. ? Eat foods that are high in fiber. These include fresh fruits and vegetables, whole grains, and beans. ? Limit foods that are high in fat and processed sugars. These include foods that are fried or sweet. ? Take medicine for trouble pooping.  When you cough, try to cough gently.  You may try to push your hernia in by very gently pressing on it when you are lying down. Do not try to force the bulge back in if it will not push in easily.  If you are overweight, work with your doctor to lose weight safely.  Do not use any products that have nicotine or tobacco in them. These include cigarettes and e-cigarettes. If you need help quitting, ask your doctor.  If you will be having surgery (hernia repair), watch  your hernia for changes in shape, size, or color. Tell your doctor if you see any changes.  Take over-the-counter and prescription medicines only as told by your doctor.  Keep all follow-up visits as told by your doctor. Contact a doctor if:  You get new pain, swelling, or redness near your hernia.  You poop fewer times in a week than normal.  You have trouble pooping.  You have poop (stool) that is more dry than normal.  You have poop that is harder or larger than normal. Get help right away if:  You have a fever.  You have belly pain that gets worse.  You feel sick to your stomach (nauseous).  You throw up (vomit).  Your hernia cannot be pushed in by very gently pressing on it when you are lying down. Do not try to force the bulge back in if it will not push in easily.  Your hernia: ? Changes in shape or size. ? Changes color. ? Feels hard or it hurts when you touch it. These symptoms may represent a serious problem that is an emergency. Do not wait to see if the symptoms will go away. Get medical help right away. Call your local emergency services (911 in the [place] Summary  A hernia happens when tissue inside your body pushes out through a weak spot in the belly muscles. This creates a bulge.  If your hernia is small and it does not hurt, you may not need treatment. If your hernia is large or it hurts, you may need surgery.  If you will be having surgery, watch your hernia for changes in shape, size, or color. Tell your doctor about any changes. This information is not intended to replace advice given  to you by your health care provider. Make sure you discuss any questions you have with your health care provider. Document Revised: 04/03/2019 Document Reviewed: 09/12/2017 Elsevier Patient Education  2020 [employer].   You are being discharged to home.  Resume your previous diet.  We are waiting for your pathology results.  Return to your GI clinic as previously  scheduled.  Can take FDGard 2-3 times a day for abdominal distention/pain. Consider starting a low FODMAP diet. Decrease intake of Norco Your physician has recommended a repeat colonoscopy in 10 years for screening purposes.

## 2020-09-01 NOTE — Interval H&P Note (Signed)
History and Physical Interval Note:  09/01/2020 8:34 AM Patient reports longstanding history of bloating and epigastric discomfort. Has tried pantoprazole without any relief. States she has also presented constipation that initially responded to Linzess 145 mcg but now is not improving. Denies any rectal bleeding, no weight loss, fever, chills, hematochezia, melena, hematemesis, diarrhea, jaundice, pruritus or weight loss. She has presented some nausea but no vomiting.  No Fhx of colon cancer. Has never had a colonoscopy in the past.   BP 126/73   Pulse 69   Temp 98.5 F (36.9 C) (Oral)   Resp 18   Ht 5\' 7"  (1.702 m)   SpO2 99%   BMI 31.25 kg/m  GENERAL: The patient is AO x3, in no acute distress. Obese. HEENT: Head is normocephalic and atraumatic. EOMI are intact. Mouth is well hydrated and without lesions. NECK: Supple. No masses LUNGS: Clear to auscultation. No presence of rhonchi/wheezing/rales. Adequate chest expansion HEART: RRR, normal s1 and s2. ABDOMEN: Soft, nontender, no guarding, no peritoneal signs, and nondistended. BS +. No masses. EXTREMITIES: Without any cyanosis, clubbing, rash, lesions or edema. NEUROLOGIC: AOx3, no focal motor deficit. SKIN: no jaundice, no rashes   Elizabeth Fox  has presented today for surgery, with the diagnosis of epigastric pain, early satiety, constipation, abdominal pain.  The various methods of treatment have been discussed with the patient and family. After consideration of risks, benefits and other options for treatment, the patient has consented to  Procedure(s) with comments: COLONOSCOPY WITH PROPOFOL (N/A) - 900 ESOPHAGOGASTRODUODENOSCOPY (EGD) WITH PROPOFOL (N/A) as a surgical intervention.  The patient's history has been reviewed, patient examined, no change in status, stable for surgery.  I have reviewed the patient's chart and labs.  Questions were answered to the patient's satisfaction.     Mayorga

## 2020-09-01 NOTE — Anesthesia Postprocedure Evaluation (Signed)
Anesthesia Post Note  Patient: Elizabeth Fox  Procedure(s) Performed: COLONOSCOPY WITH PROPOFOL (N/A ) ESOPHAGOGASTRODUODENOSCOPY (EGD) WITH PROPOFOL (N/A ) BIOPSY  Patient location during evaluation: Endoscopy Anesthesia Type: General Level of consciousness: awake and alert, oriented and patient cooperative Pain management: pain level controlled Vital Signs Assessment: post-procedure vital signs reviewed and stable Respiratory status: spontaneous breathing and respiratory function stable Cardiovascular status: blood pressure returned to baseline and stable Postop Assessment: no headache and no apparent nausea or vomiting Anesthetic complications: no   No complications documented.   Last Vitals:  Vitals:   09/01/20 0747 09/01/20 1019  BP: 126/73 101/83  Pulse: 69 99  Resp: 18 18  Temp: 36.9 C 36.9 C  SpO2: 99% 99%    Last Pain:  Vitals:   09/01/20 1019  TempSrc: Axillary  PainSc: 0-No pain                 Seniya Stoffers

## 2020-09-01 NOTE — Anesthesia Preprocedure Evaluation (Signed)
Anesthesia Evaluation  °Patient identified by MRN, date of birth, ID band °Patient awake ° ° ° °Reviewed: °Allergy & Precautions, H&P , NPO status , Patient's Chart, lab work & pertinent test results, reviewed documented beta blocker date and time  ° °Airway °Mallampati: II ° °TM Distance: >3 FB °Neck ROM: full ° ° ° Dental °no notable dental hx. ° °  °Pulmonary °neg pulmonary ROS,  °  °Pulmonary exam normal °breath sounds clear to auscultation ° ° ° ° ° ° Cardiovascular °Exercise Tolerance: Good °negative cardio ROS ° ° °Rhythm:regular Rate:Normal ° ° °  °Neuro/Psych ° Headaches, negative psych ROS  ° GI/Hepatic °negative GI ROS, Neg liver ROS,   °Endo/Other  °negative endocrine ROS ° Renal/GU °negative Renal ROS  °negative genitourinary °  °Musculoskeletal ° ° Abdominal °  °Peds ° Hematology °negative hematology ROS °(+)   °Anesthesia Other Findings ° ° Reproductive/Obstetrics °negative OB ROS ° °  ° ° ° ° ° ° ° ° ° ° ° ° ° °  °  ° ° ° ° ° ° ° ° °Anesthesia Physical °Anesthesia Plan ° °ASA: II ° °Anesthesia Plan: General  ° °Post-op Pain Management:   ° °Induction:  ° °PONV Risk Score and Plan: Propofol infusion ° °Airway Management Planned:  ° °Additional Equipment:  ° °Intra-op Plan:  ° °Post-operative Plan:  ° °Informed Consent: I have reviewed the patients History and Physical, chart, labs and discussed the procedure including the risks, benefits and alternatives for the proposed anesthesia with the patient or authorized representative who has indicated his/her understanding and acceptance.  ° ° ° °Dental Advisory Given ° °Plan Discussed with: CRNA ° °Anesthesia Plan Comments:   ° ° ° ° ° ° °Anesthesia Quick Evaluation ° °

## 2020-09-01 NOTE — Op Note (Signed)
[facility] Patient Name: Elizabeth Fox Procedure Date: 09/01/2020 9:56 AM MRN: [id number] Date of Birth: [birth year] Attending MD: [name] ,  CSN: [id number] Age: 44 Admit Type: Outpatient Procedure:                Colonoscopy Indications:              Constipation Providers:                [name], Sheran Fava, Jannett Celestine,                            RN, Cyril Mourning, Technician Referring MD:              Medicines:                Monitored Anesthesia Care Complications:            No immediate complications. Estimated Blood Loss:     Estimated blood loss: none. Procedure:                Pre-Anesthesia Assessment:                           - Prior to the procedure, a History and Physical                            was performed, and patient medications, allergies                            and sensitivities were reviewed. The patient's                            tolerance of previous anesthesia was reviewed.                           - The risks and benefits of the procedure and the                            sedation options and risks were discussed with the                            patient. All questions were answered and informed                            consent was obtained.                           After obtaining informed consent, the colonoscope                            was passed under direct vision. Throughout the                            procedure, the patient's blood pressure, pulse, and                            oxygen saturations were monitored continuously. The  [id number]-[id number] ([id number]) scope was introduced through                            the anus and advanced to the the cecum, identified                            by appendiceal orifice and ileocecal valve. The                            colonoscopy was performed without difficulty. The                            patient tolerated the procedure  well. The quality                            of the bowel preparation was good. Scope withdrawal                            time was 10 minutes. Scope In: 9:56:32 AM Scope Out: 10:14:06 AM Scope Withdrawal Time: 0 hours 10 minutes 51 seconds  Total Procedure Duration: 0 hours 17 minutes 34 seconds  Findings:      The perianal and digital rectal examinations were normal.      The colon (entire examined portion) appeared normal.      Non-bleeding internal hemorrhoids were found during retroflexion. The       hemorrhoids were small. Impression:               - The entire examined colon is normal.                           - Non-bleeding internal hemorrhoids.                           - No specimens collected. Moderate Sedation:      Per Anesthesia Care Recommendation:           - Discharge patient to home (ambulatory).                           - Resume previous diet.                           - Repeat colonoscopy in 10 years for screening                            purposes.                           - If symptoms persist despite taking Linzess                            compliantly, may consider performing anorectal                            manometry/defecography. Procedure Code(s):        --- Professional ---  89381, GC, Colonoscopy, flexible; diagnostic,                            including collection of specimen(s) by brushing or                            washing, when performed (separate procedure) Diagnosis Code(s):        --- Professional ---                           K64.8, Other hemorrhoids CPT copyright 2019 [employer]. All rights reserved. The codes documented in this report are preliminary and upon coder review may  be revised to meet current compliance requirements. [name], MD [name],  09/01/2020 10:20:59 AM This report has been signed electronically. Number of Addenda: 0

## 2020-09-02 LAB — SURGICAL PATHOLOGY

## 2020-09-03 ENCOUNTER — Encounter (HOSPITAL_COMMUNITY): Payer: Self-pay | Admitting: Gastroenterology

## 2020-09-08 ENCOUNTER — Encounter (INDEPENDENT_AMBULATORY_CARE_PROVIDER_SITE_OTHER): Payer: Self-pay | Admitting: Gastroenterology

## 2020-09-08 ENCOUNTER — Other Ambulatory Visit: Payer: Self-pay

## 2020-09-08 ENCOUNTER — Ambulatory Visit (INDEPENDENT_AMBULATORY_CARE_PROVIDER_SITE_OTHER): Payer: BC Managed Care – PPO | Admitting: Gastroenterology

## 2020-09-08 DIAGNOSIS — T40601A Poisoning by unspecified narcotics, accidental (unintentional), initial encounter: Secondary | ICD-10-CM

## 2020-09-08 DIAGNOSIS — K6389 Other specified diseases of intestine: Secondary | ICD-10-CM | POA: Diagnosis not present

## 2020-09-08 DIAGNOSIS — K589 Irritable bowel syndrome without diarrhea: Secondary | ICD-10-CM | POA: Insufficient documentation

## 2020-09-08 DIAGNOSIS — K581 Irritable bowel syndrome with constipation: Secondary | ICD-10-CM | POA: Diagnosis not present

## 2020-09-08 MED ORDER — HYOSCYAMINE SULFATE 0.125 MG SL SUBL: 0.1250 mg | SUBLINGUAL_TABLET | Freq: Four times a day (QID) | SUBLINGUAL | 2 refills | Status: DC | PRN

## 2020-09-08 NOTE — Patient Instructions (Addendum)
Decrease intake of Norco - try to stop it Explained presumed etiology of bowel hypersensitivity symptoms. Patient was counseled about the benefit of implementing a low FODMAP to improve symptoms and recurrent episodes. A dietary list was provided to the patient. Also, the patient was counseled about the benefit of avoiding stressing situations and potential environmental triggers leading to symptomatology. Levsin as needed for abdominal pain Start taking Miralax 1 cap every 12 hours. If after one week there is no improvement, increase to 1 cup every 8 hours Continue with Linzess 290 mcg qday Stop FDGard and pantoprazole

## 2020-09-08 NOTE — Progress Notes (Signed)
Elizabeth Fox, M.D. Gastroenterology & Hepatology [facility] [address] [place], [place] [address]  Primary Care Physician: Pcp, No No address on file  I will communicate my assessment and recommendations to the referring MD via EMR. "Note: Occasional unusual wording and randomly placed punctuation marks may result from the use of speech recognition technology to transcribe this document"  Problems: 1. IBS-C 2. Narcotic bowel 3. Small hiatal hernia  History of Present Illness: Elizabeth Fox is a 44 y.o. female with PMH migraine and chronic narcotic use, who presents for follow up of abdominal pain and constipation.  The patient was last seen on 08/23/2020. At that time, the patient was given a new prescription for Linzess 290 mcg/day (previously on 145 mcg), she was ordered an EGD and colonoscopy.  These procedures were performed on 09/01/2020.  EGD showed a 1 cm hiatal hernia without any other alterations, biopsies of the small bowel were negative for celiac disease.  Colonoscopy showed presence of internal hemorrhoids.  The patient was counseled to start [facility] and a low FODMAP diet.  The patient reports that she is still having significant symptoms in the moment.  States that she is having generalized abdominal pain throughout the day.  Occasionally the pain is present when she is sleeping and wakes her up in the middle of night, this happens a couple times a week.  She has been compliant with the Linzess intake which she reports has improved her symptoms slightly.  She is still not having regular bowel movements as some that she has to wait up to 3 days to have a bowel movement while taking the Linzess.  Has made some dietary changes such as avoiding dairy or spicy food but not a specific diet.  However, she has not felt any improvement while taking the pantoprazole or FDGard I would like to know if she needs to continue taking these  medications.  Patient reports that she has been taking Norco for "multiple years"for management of migraines.  States that she take it up to 6 times per month.  The patient denies having any vomiting, fever, chills, hematochezia, melena, hematemesis, diarrhea, jaundice, pruritus.  Past Medical History: Past Medical History:  Diagnosis Date  . Migraines     Past Surgical History: Past Surgical History:  Procedure Laterality Date  . BIOPSY  09/01/2020   Procedure: BIOPSY;  Surgeon: Dolores Frame, MD;  [facility];  Service: Gastroenterology;;  . COLONOSCOPY WITH PROPOFOL N/A 09/01/2020   Procedure: COLONOSCOPY WITH PROPOFOL;  Surgeon: Dolores Frame, MD;  [facility];  Service: Gastroenterology;  Laterality: N/A;  900  . ESOPHAGOGASTRODUODENOSCOPY (EGD) WITH PROPOFOL N/A 09/01/2020   Procedure: ESOPHAGOGASTRODUODENOSCOPY (EGD) WITH PROPOFOL;  Surgeon: Dolores Frame, MD;  [facility];  Service: Gastroenterology;  Laterality: N/A;  . TUBAL LIGATION      Family History: Family History  Problem Relation Age of Onset  . Diabetes Mother     Social History: Social History   Tobacco Use  Smoking Status Never Smoker  Smokeless Tobacco Never Used   Social History   Substance and Sexual Activity  Alcohol Use Never   Social History   Substance and Sexual Activity  Drug Use Never    Allergies: No Known Allergies  Medications: Current Outpatient Medications  Medication Sig Dispense Refill  . ALPRAZolam (XANAX) 1 MG tablet Take 1 mg by mouth 3 (three) times daily as needed for anxiety.     Marland Kitchen  HYDROcodone-acetaminophen (NORCO) 10-325 MG tablet Take 1 tablet by mouth every 8 (eight) hours as needed for pain.    Marland Kitchen ketoconazole (NIZORAL) 2 % cream Apply 1 application topically daily. APPLIED TO FACE    . linaclotide (LINZESS) 290 MCG CAPS capsule Take 1 capsule (290 mcg total) by mouth daily before breakfast. 30  capsule 3  . medroxyPROGESTERone (DEPO-PROVERA) 150 MG/ML injection Inject 150 mg into the muscle every 3 (three) months.    . pantoprazole (PROTONIX) 40 MG tablet Take 1 tablet (40 mg total) by mouth 2 (two) times daily before a meal. 60 tablet 3  . dicyclomine (BENTYL) 20 MG tablet Take 1 tablet (20 mg total) by mouth 2 (two) times daily. (Patient not taking: Reported on 08/26/2020) 20 tablet 0  . lidocaine (LIDODERM) 5 % Place 1 patch onto the skin daily. Remove & Discard patch within 12 hours or as directed by MD (Patient not taking: Reported on 08/23/2020) 30 patch 0  . ondansetron (ZOFRAN ODT) 4 MG disintegrating tablet Take 1 tablet (4 mg total) by mouth every 8 (eight) hours as needed for nausea or vomiting. (Patient not taking: Reported on 08/26/2020) 20 tablet 0   No current facility-administered medications for this visit.    Review of Systems: GENERAL: negative for malaise, night sweats HEENT: No changes in hearing or vision, no nose bleeds or other nasal problems. NECK: Negative for lumps, goiter, pain and significant neck swelling RESPIRATORY: Negative for cough, wheezing CARDIOVASCULAR: Negative for chest pain, leg swelling, palpitations, orthopnea GI: SEE HPI MUSCULOSKELETAL: Negative for joint pain or swelling, back pain, and muscle pain. SKIN: Negative for lesions, rash PSYCH: Negative for sleep disturbance, mood disorder and recent psychosocial stressors. HEMATOLOGY Negative for prolonged bleeding, bruising easily, and swollen nodes. ENDOCRINE: Negative for cold or heat intolerance, polyuria, polydipsia and goiter. NEURO: negative for tremor, gait imbalance, syncope and seizures. The remainder of the review of systems is noncontributory.   Physical Exam: BP 116/79 (BP Location: Right Arm, Patient Position: Sitting, Cuff Size: Small)   Pulse (!) 109   Temp 98 F (36.7 C) (Oral)   Ht 5\' 7"  (1.702 m)   Wt 196 lb 4.8 oz (89 kg)   BMI 30.74 kg/m  GENERAL: The patient is AO  x3, in no acute distress. HEENT: Head is normocephalic and atraumatic. EOMI are intact. Mouth is well hydrated and without lesions. NECK: Supple. No masses LUNGS: Clear to auscultation. No presence of rhonchi/wheezing/rales. Adequate chest expansion HEART: RRR, normal s1 and s2. ABDOMEN: diffusely tender to deep palpation but no guarding, no peritoneal signs, and nondistended. BS +. No masses. EXTREMITIES: Without any cyanosis, clubbing, rash, lesions or edema. NEUROLOGIC: AOx3, no focal motor deficit. SKIN: no jaundice, no rashes  Imaging/Labs: as above  I personally reviewed and interpreted the available labs, imaging and endoscopic files.  Impression and Plan: Elizabeth Fox is a 44 y.o. female with PMH migraine and chronic narcotic use, who presents for follow up of abdominal pain and constipation.  The patient has had significant investigations recently including upper and lower endoscopy, and cross-sectional imaging which did not show any major alterations explain for her symptomatology.  Importantly, she has been chronically taking opiates which increases risk for narcotic bowel on top of her IBS-C.  The patient was counseled about the importance of stopping this medication as this can worsen her current condition, but she was advised about the possible irreversible alterations caused by the use of these medications chronically.  She has not been  taking any antispasmodic, which may benefit her improvement in the symptomatology for which I will prescribe Levsin, but she was also counseled to start strict low FODMAP diet which may help improving her symptoms.  As she has not had any improvement with the use of pantoprazole or FDGard, she can stop these medications.  Finally, she has had some improvement of her constipation with the use of Linzess but this could be further enhanced by the use of MiraLAX up to 3 caps per day.  -Decrease intake of Norco - try to stop it -Explained presumed  etiology of bowel hypersensitivity symptoms. Patient was counseled about the benefit of implementing a low FODMAP to improve symptoms and recurrent episodes. A dietary list was provided to the patient. Also, the patient was counseled about the benefit of avoiding stressing situations and potential environmental triggers leading to symptomatology. -Start Levsin as needed for abdominal pain -Start taking Miralax 1 cap every 12 hours. If after one week there is no improvement, increase to 1 cup every 8 hours -Continue with Linzess 290 mcg qday -Stop FDGard and pantoprazole  All questions were answered.      Dolores Frame, MD Gastroenterology and Hepatology [facility]

## 2020-09-14 ENCOUNTER — Telehealth (INDEPENDENT_AMBULATORY_CARE_PROVIDER_SITE_OTHER): Payer: Self-pay | Admitting: Gastroenterology

## 2020-09-14 NOTE — Telephone Encounter (Signed)
Patient called stated the pharmacy did not receive the prescription sent on 9/15 - please advise - ph# [phone]

## 2020-09-14 NOTE — Telephone Encounter (Signed)
Called patient, she did not pick up the phone. Called the pharmacy myself and sent the prescription for Levsin, they are working on having it ready for the patient.  Thanks  [name], MD Gastroenterology and Hepatology [facility]

## 2020-10-07 ENCOUNTER — Telehealth (INDEPENDENT_AMBULATORY_CARE_PROVIDER_SITE_OTHER): Payer: Self-pay | Admitting: Gastroenterology

## 2020-10-07 ENCOUNTER — Other Ambulatory Visit (INDEPENDENT_AMBULATORY_CARE_PROVIDER_SITE_OTHER): Payer: Self-pay

## 2020-10-07 NOTE — Telephone Encounter (Signed)
Noted patient understands and will pick up paperwork to take to her PCP for further instructions

## 2020-10-07 NOTE — Telephone Encounter (Signed)
Please notify patient I received for paperwork through [facility] financial for long-term disability for her.  We generally do not complete paperwork for long-term disability.  She saw Dr. Levon Hedger in the office about a month ago.  If she feels her symptoms are not improved and is not able to work due to symptoms recommended follow-up appointment to discuss.

## 2020-10-07 NOTE — Telephone Encounter (Signed)
Dr. Levon Hedger is the long term disability paperwork something you feel comfortable doing for this patient? You saw her 09/08/20 and she has a follow up appointment scheduled with you on 12/09/20, please advise?

## 2020-10-07 NOTE — Telephone Encounter (Signed)
The patient has had previous upper and lower endoscopy, as well as a recent CT of the abdomen which have been negative for any organic pathology.  Her symptoms are related to combination of IBS-C and narcotic bowel.  There is no reason for long-term disability for these conditions but she requires medical treatment as discussed.  [name], MD Gastroenterology and Hepatology [facility]

## 2020-10-15 ENCOUNTER — Other Ambulatory Visit (INDEPENDENT_AMBULATORY_CARE_PROVIDER_SITE_OTHER): Payer: Self-pay | Admitting: Gastroenterology

## 2020-12-09 ENCOUNTER — Ambulatory Visit (INDEPENDENT_AMBULATORY_CARE_PROVIDER_SITE_OTHER): Payer: BC Managed Care – PPO | Admitting: Gastroenterology

## 2020-12-09 ENCOUNTER — Other Ambulatory Visit: Payer: Self-pay

## 2020-12-09 ENCOUNTER — Encounter (INDEPENDENT_AMBULATORY_CARE_PROVIDER_SITE_OTHER): Payer: Self-pay | Admitting: Gastroenterology

## 2020-12-09 DIAGNOSIS — K581 Irritable bowel syndrome with constipation: Secondary | ICD-10-CM | POA: Diagnosis not present

## 2020-12-09 MED ORDER — AMITRIPTYLINE HCL 10 MG PO TABS: 10.0000 mg | ORAL_TABLET | Freq: Every day | ORAL | 1 refills | Status: DC

## 2020-12-09 MED ORDER — HYOSCYAMINE SULFATE 0.125 MG SL SUBL: 0.1250 mg | SUBLINGUAL_TABLET | Freq: Four times a day (QID) | SUBLINGUAL | 2 refills | Status: DC | PRN

## 2020-12-09 NOTE — Progress Notes (Signed)
Elizabeth Fox, M.D. Gastroenterology & Hepatology [facility] [address] [place], [place] [address]  Primary Care Physician: Pcp, No No address on file  I will communicate my assessment and recommendations to the referring MD via EMR. "Note: Occasional unusual wording and randomly placed punctuation marks may result from the use of speech recognition technology to transcribe this document"  Problems: 1. IBS-C 2. Small hiatal hernia  History of Present Illness: Elizabeth Fox is a 44 y.o. female with PMH migraine, IBS-C and chronic narcotic use, who presents for follow up of abdominal pain and constipation.  The patient was last seen on 09/08/2020. At that time, the patient was advised to stop taking opiates and she was recommended to follow a low FODMAP diet, start Levsin as needed for abdominal pain and MiraLAX to increase her bowel movement frequency.  She was also continued on Linzess 290 mcg for constipation.  Patient reports that she has been taking the Levsin compliantly but has not felt significant improvement in her symptoms.  States that Levsin transiently improves part of her pain but does not take it away completely she has not been taking any opiates.  States that she is still having significant abdominal pain in her left upper quadrant when she bends down or lifts objects.  States that her symptoms are the same as in the past.  In fact, her symptoms worsened during the last weekend and she presented persistent nausea with some episodes of vomiting.  The patient denies having any ever, chills, hematochezia, melena, hematemesis, abdominal distention,  diarrhea, jaundice, pruritus or weight loss.  Past Medical History: Past Medical History:  Diagnosis Date  . Migraines     Past Surgical History: Past Surgical History:  Procedure Laterality Date  . BIOPSY  09/01/2020   Procedure: BIOPSY;  Surgeon: Dolores Frame, MD;  [facility];  Service: Gastroenterology;;  . COLONOSCOPY WITH PROPOFOL N/A 09/01/2020   Procedure: COLONOSCOPY WITH PROPOFOL;  Surgeon: Dolores Frame, MD;  [facility];  Service: Gastroenterology;  Laterality: N/A;  900  . ESOPHAGOGASTRODUODENOSCOPY (EGD) WITH PROPOFOL N/A 09/01/2020   Procedure: ESOPHAGOGASTRODUODENOSCOPY (EGD) WITH PROPOFOL;  Surgeon: Dolores Frame, MD;  [facility];  Service: Gastroenterology;  Laterality: N/A;  . TUBAL LIGATION      Family History: Family History  Problem Relation Age of Onset  . Diabetes Mother     Social History: Social History   Tobacco Use  Smoking Status Never Smoker  Smokeless Tobacco Never Used   Social History   Substance and Sexual Activity  Alcohol Use Never   Social History   Substance and Sexual Activity  Drug Use Never    Allergies: No Known Allergies  Medications: Current Outpatient Medications  Medication Sig Dispense Refill  . ALPRAZolam (XANAX) 1 MG tablet Take 1 mg by mouth 3 (three) times daily as needed for anxiety.     Marland Kitchen HYDROcodone-acetaminophen (NORCO) 10-325 MG tablet Take 1 tablet by mouth every 8 (eight) hours as needed for pain.    . hyoscyamine (LEVSIN SL) 0.125 MG SL tablet Place 1 tablet (0.125 mg total) under the tongue every 6 (six) hours as needed for cramping. 90 tablet 2  . ketoconazole (NIZORAL) 2 % cream Apply 1 application topically daily. APPLIED TO FACE    . linaclotide (LINZESS) 290 MCG CAPS capsule Take 1 capsule (290 mcg total) by mouth daily before breakfast. 30 capsule 3  . medroxyPROGESTERone (DEPO-PROVERA) 150  MG/ML injection Inject 150 mg into the muscle every 3 (three) months.    . ondansetron (ZOFRAN ODT) 4 MG disintegrating tablet Take 1 tablet (4 mg total) by mouth every 8 (eight) hours as needed for nausea or vomiting. (Patient not taking: No sig reported) 20 tablet 0   No current facility-administered medications  for this visit.    Review of Systems: GENERAL: negative for malaise, night sweats HEENT: No changes in hearing or vision, no nose bleeds or other nasal problems. NECK: Negative for lumps, goiter, pain and significant neck swelling RESPIRATORY: Negative for cough, wheezing CARDIOVASCULAR: Negative for chest pain, leg swelling, palpitations, orthopnea GI: SEE HPI MUSCULOSKELETAL: Negative for joint pain or swelling, back pain, and muscle pain. SKIN: Negative for lesions, rash PSYCH: Negative for sleep disturbance, mood disorder and recent psychosocial stressors. HEMATOLOGY Negative for prolonged bleeding, bruising easily, and swollen nodes. ENDOCRINE: Negative for cold or heat intolerance, polyuria, polydipsia and goiter. NEURO: negative for tremor, gait imbalance, syncope and seizures. The remainder of the review of systems is noncontributory.   Physical Exam: BP 124/76 (BP Location: Left Arm, Patient Position: Sitting, Cuff Size: Large)   Pulse 63   Temp 98.5 F (36.9 C) (Oral)   Ht 5\' 7"  (1.702 m)   Wt 199 lb (90.3 kg)   BMI 31.17 kg/m  GENERAL: The patient is AO x3, in no acute distress. HEENT: Head is normocephalic and atraumatic. EOMI are intact. Mouth is well hydrated and without lesions. NECK: Supple. No masses LUNGS: Clear to auscultation. No presence of rhonchi/wheezing/rales. Adequate chest expansion HEART: RRR, normal s1 and s2. ABDOMEN: tender to palpation diffusely but worse in the lower abdomen and periumbilical area, no guarding, no peritoneal signs, and nondistended. BS +. No masses. EXTREMITIES: Without any cyanosis, clubbing, rash, lesions or edema. NEUROLOGIC: AOx3, no focal motor deficit. SKIN: no jaundice, no rashes  Imaging/Labs: as above  I personally reviewed and interpreted the available labs, imaging and endoscopic files.  Impression and Plan: Elizabeth Fox is a 44 y.o. female with PMH migraine, IBS-C and chronic narcotic use, who presents for  follow up of abdominal pain and constipation.  Patient presented persistent symptoms of abdominal pain which have mildly improved with intake of antispasmodic medication but are still present.  Had a lengthy conversation with the patient regarding the fact her endoscopic and imaging investigations have been negative for an etiology explaining her symptoms.  Hence, it is very likely that her symptoms are related to a functional abdominal pain.  I explained to her that the next step in management of her pain will be a low-dose TCA, which will be started today.  I explained to her that it may take weeks until the full effect of the current dosage is seen so she could still take the Levsin as needed to improve her symptoms.  This medication will be refilled today.  The patient understood and agreed.  - Start Elavil 10 mg 30 minutes before going to sleep - Continue Levsin as needed for abdominal pain every 6 hours - RTC 3 months  All questions were answered.      59, MD Gastroenterology and Hepatology [facility]

## 2020-12-09 NOTE — Patient Instructions (Addendum)
Start Elavil 10 mg 30 minutes before going to sleep Continue Levsin as needed for abdominal pain every 6 hours

## 2020-12-10 ENCOUNTER — Encounter (INDEPENDENT_AMBULATORY_CARE_PROVIDER_SITE_OTHER): Payer: Self-pay | Admitting: Gastroenterology

## 2021-01-22 ENCOUNTER — Other Ambulatory Visit (INDEPENDENT_AMBULATORY_CARE_PROVIDER_SITE_OTHER): Payer: Self-pay | Admitting: Gastroenterology

## 2021-01-22 DIAGNOSIS — K581 Irritable bowel syndrome with constipation: Secondary | ICD-10-CM

## 2021-01-24 NOTE — Telephone Encounter (Signed)
Note on Dr. Patty Sermons desk to see if Alternative available.

## 2021-01-25 ENCOUNTER — Other Ambulatory Visit (INDEPENDENT_AMBULATORY_CARE_PROVIDER_SITE_OTHER): Payer: Self-pay

## 2021-01-25 DIAGNOSIS — K6389 Other specified diseases of intestine: Secondary | ICD-10-CM

## 2021-01-25 DIAGNOSIS — K581 Irritable bowel syndrome with constipation: Secondary | ICD-10-CM

## 2021-01-25 DIAGNOSIS — K5909 Other constipation: Secondary | ICD-10-CM

## 2021-01-25 DIAGNOSIS — R1084 Generalized abdominal pain: Secondary | ICD-10-CM

## 2021-01-25 DIAGNOSIS — R110 Nausea: Secondary | ICD-10-CM

## 2021-01-25 MED ORDER — DICYCLOMINE HCL 10 MG PO CAPS: 10.0000 mg | ORAL_CAPSULE | Freq: Three times a day (TID) | ORAL | 11 refills | Status: AC

## 2021-03-17 ENCOUNTER — Ambulatory Visit (INDEPENDENT_AMBULATORY_CARE_PROVIDER_SITE_OTHER): Payer: BC Managed Care – PPO | Admitting: Gastroenterology

## 2021-03-20 ENCOUNTER — Other Ambulatory Visit (INDEPENDENT_AMBULATORY_CARE_PROVIDER_SITE_OTHER): Payer: Self-pay | Admitting: Gastroenterology

## 2021-04-06 ENCOUNTER — Other Ambulatory Visit (INDEPENDENT_AMBULATORY_CARE_PROVIDER_SITE_OTHER): Payer: Self-pay | Admitting: Gastroenterology

## 2021-04-06 ENCOUNTER — Telehealth (INDEPENDENT_AMBULATORY_CARE_PROVIDER_SITE_OTHER): Payer: Self-pay | Admitting: Gastroenterology

## 2021-04-06 DIAGNOSIS — K581 Irritable bowel syndrome with constipation: Secondary | ICD-10-CM

## 2021-04-06 MED ORDER — LINACLOTIDE 290 MCG PO CAPS: 290.0000 ug | ORAL_CAPSULE | Freq: Every day | ORAL | 3 refills | Status: DC

## 2021-04-06 NOTE — Telephone Encounter (Signed)
Refill sent.

## 2021-04-06 NOTE — Telephone Encounter (Signed)
Patient last seen 12/09/2020 IBS with Dr. Levon Hedger.

## 2021-04-06 NOTE — Telephone Encounter (Signed)
Noted. I tried calling the patient to notify that medication has been sent to the requested pharmacy. Voice mail not set up.

## 2021-04-06 NOTE — Telephone Encounter (Signed)
Patient called the office states she needs a refill on Linzess - send to [facility] - please advise - ph# [phone]

## 2021-05-07 ENCOUNTER — Other Ambulatory Visit (INDEPENDENT_AMBULATORY_CARE_PROVIDER_SITE_OTHER): Payer: Self-pay | Admitting: Gastroenterology

## 2021-05-19 ENCOUNTER — Encounter (INDEPENDENT_AMBULATORY_CARE_PROVIDER_SITE_OTHER): Payer: Self-pay | Admitting: Gastroenterology

## 2021-05-19 ENCOUNTER — Telehealth (INDEPENDENT_AMBULATORY_CARE_PROVIDER_SITE_OTHER): Payer: Medicaid - Out of State | Admitting: Gastroenterology

## 2021-05-19 ENCOUNTER — Other Ambulatory Visit: Payer: Self-pay

## 2021-05-19 DIAGNOSIS — K581 Irritable bowel syndrome with constipation: Secondary | ICD-10-CM

## 2021-05-19 NOTE — Progress Notes (Signed)
[name], M.D. Gastroenterology & Hepatology [facility] [address] [place], [place] [address] Primary Care Physician: Pcp, No No address on file  This is a telephone virtual visit.  It required patient-provider interaction for the medical decision making as documented below. The patient has consented and agreed to proceed with a Telehealth encounter given the current Coronavirus pandemic.  VIRTUAL VISIT NOTE Patient location home Provider location: home  I will communicate my assessment and recommendations to the referring MD via EMR.  Problems: 1. IBS-C  History of Present Illness: Elizabeth Fox is a 45 y.o. female femalewith PMH migraine, IBS-C and previous narcotic use,who presents for follow up ofabdominal pain and constipation.  Last seen in clinic on 12/09/2020.  At that time the patient was counseled to start Elavil 10 mg every night and to continue Levsin as needed for abdominal pain.  Patient reports that she is still has significant abdominal pain which is mainly located in her lower abdomen. She felt some improvement for a few weeks after starting the Elavil but states that for the last couple of weeks she has had significant pain in her lower abdomen, along with presence of very scant bowel movement despite of the fact she takes Linzess 290 mcg qday.  States she moves her bowels once or twice a day but the amount of stool is noted off and she feels that she needs to move more her bowels.  She has also presented some nausea and discomfort in her abdomen.  On the other hand, for the last 2 months she presented black discoloration of the tongue. Last two weeks it has turned back to white intermittently. Sometimes she feels some pain in her mouth. Was advised to see an ENT but has not seen them yet and has only followed with her PCP. She has been told she may have long COVID symptoms as these symptoms exacerbated after  this infection..  The patient denies having any nausea, vomiting, fever, chills, hematochezia, melena, hematemesis,  diarrhea, jaundice, pruritus or weight loss.  Last EGD:  09/01/2020 1 cm hiatal hernia without any other alterations, biopsies of the small bowel were negative for celiac disease Last Colonoscopy: 09/01/2020 internal hemorrhoids.     Past Medical History: Past Medical History:  Diagnosis Date  . Migraines     Past Surgical History: Past Surgical History:  Procedure Laterality Date  . BIOPSY  09/01/2020   Procedure: BIOPSY;  Surgeon: Dolores Frame, MD;  [facility];  Service: Gastroenterology;;  . COLONOSCOPY WITH PROPOFOL N/A 09/01/2020   Procedure: COLONOSCOPY WITH PROPOFOL;  Surgeon: Dolores Frame, MD;  [facility];  Service: Gastroenterology;  Laterality: N/A;  900  . ESOPHAGOGASTRODUODENOSCOPY (EGD) WITH PROPOFOL N/A 09/01/2020   Procedure: ESOPHAGOGASTRODUODENOSCOPY (EGD) WITH PROPOFOL;  Surgeon: Dolores Frame, MD;  [facility];  Service: Gastroenterology;  Laterality: N/A;  . TUBAL LIGATION      Family History: Family History  Problem Relation Age of Onset  . Diabetes Mother     Social History: Social History   Tobacco Use  Smoking Status Never Smoker  Smokeless Tobacco Never Used   Social History   Substance and Sexual Activity  Alcohol Use Never   Social History   Substance and Sexual Activity  Drug Use Never    Allergies: No Known Allergies  Medications: Current Outpatient Medications  Medication Sig Dispense Refill  . ALPRAZolam (XANAX) 1 MG tablet Take 1 mg by  mouth 3 (three) times daily as needed for anxiety.     Marland Kitchen amitriptyline (ELAVIL) 10 MG tablet TAKE 1 TABLET BY MOUTH EVERYDAY AT BEDTIME 90 tablet 2  . dicyclomine (BENTYL) 10 MG capsule Take 1 capsule (10 mg total) by mouth 3 (three) times daily before meals. prn 90 capsule 11  . ELDERBERRY PO Take by mouth daily at  6 (six) AM. Two po QD.    Marland Kitchen ketoconazole (NIZORAL) 2 % cream Apply 1 application topically daily. APPLIED TO FACE    . Lactobacillus (PROBIOTIC ACIDOPHILUS PO) Take by mouth daily. 2 Gummies daily.    Marland Kitchen linaclotide (LINZESS) 290 MCG CAPS capsule Take 1 capsule (290 mcg total) by mouth daily before breakfast. 90 capsule 3  . medroxyPROGESTERone (DEPO-PROVERA) 150 MG/ML injection Inject 150 mg into the muscle every 3 (three) months.    . Multiple Vitamins-Minerals (MULTIVITAL PO) Take by mouth. 2 gummies daily    . ondansetron (ZOFRAN ODT) 4 MG disintegrating tablet Take 1 tablet (4 mg total) by mouth every 8 (eight) hours as needed for nausea or vomiting. (Patient not taking: No sig reported) 20 tablet 0   No current facility-administered medications for this visit.    Review of Systems: GENERAL: negative for malaise, night sweats HEENT: No changes in hearing or vision, no nose bleeds or other nasal problems. NECK: Negative for lumps, goiter, pain and significant neck swelling RESPIRATORY: Negative for cough, wheezing CARDIOVASCULAR: Negative for chest pain, leg swelling, palpitations, orthopnea GI: SEE HPI MUSCULOSKELETAL: Negative for joint pain or swelling, back pain, and muscle pain. SKIN: Negative for lesions, rash PSYCH: Negative for sleep disturbance, mood disorder and recent psychosocial stressors. HEMATOLOGY Negative for prolonged bleeding, bruising easily, and swollen nodes. ENDOCRINE: Negative for cold or heat intolerance, polyuria, polydipsia and goiter. NEURO: negative for tremor, gait imbalance, syncope and seizures. The remainder of the review of systems is noncontributory.   Physical Exam: No exam was performed as this was a telephone encounter  Imaging/Labs: as above  I personally reviewed and interpreted the available labs, imaging and endoscopic files.  Impression and Plan: Elizabeth Fox is a 45 y.o. female femalewith PMH migraine, IBS-C and previous narcotic  use,who presents for follow up ofabdominal pain and constipation.  The patient has presented significant symptoms of constipation that have not improved with the use of Linzess.  This is not typical for primary constipation which makes me concerned about the possibility of a concomitant defecation defect leading to her symptoms and abdominal pain.  Due to this, I counseled the patient that she should proceed with a anorectal manometry to evaluate for defecation abnormalities.  She will continue Linzess for now but she will need to hold this before having her test performed.  Can also consider switching to an SSRI in the future as the Elavil may have actually worsened her constipation but we will address these in a subsequent appointment.  -Schedule anorectal manometry - will be referred to [facility] -Continue with Linzess 290 mcg qday  All questions were answered.      Total visit time: I spent a total of  25 minutes  [name], MD Gastroenterology and Hepatology [facility]

## 2021-05-19 NOTE — Patient Instructions (Signed)
Schedule anorectal manometry - will be referred to [facility] Continue with Linzess 290 mcg qday

## 2021-05-20 ENCOUNTER — Encounter (INDEPENDENT_AMBULATORY_CARE_PROVIDER_SITE_OTHER): Payer: Self-pay | Admitting: Gastroenterology

## 2021-06-30 ENCOUNTER — Telehealth (INDEPENDENT_AMBULATORY_CARE_PROVIDER_SITE_OTHER): Payer: Self-pay | Admitting: Gastroenterology

## 2021-06-30 NOTE — Telephone Encounter (Signed)
Patient left message stating she got a call from [facility] regarding test results - please advise - ph# [phone]

## 2021-07-01 ENCOUNTER — Telehealth (INDEPENDENT_AMBULATORY_CARE_PROVIDER_SITE_OTHER): Payer: Self-pay | Admitting: Gastroenterology

## 2021-07-01 NOTE — Telephone Encounter (Signed)
I called the patient to discuss the results of her most recent anorectal manometry which showed a normal internal anal sphincter resting pressure, with presence of 1 external anal sphincter that was unable to generate and maintain the squeeze with decreased contraction during cough (unclear if relevant as she has constipation).  Also had adequate relaxation of the external anal sphincter during the strain but she was not able to pass the balloon during expulsion testing.  There RAIR was present and without any alterations  The patient unfortunately did not pick up the phone and I left a voice message explaining the findings and the need to proceed with an MR defecography if she is still having significant constipation.  I left her a message stating that if she wants to proceed with this we will refer her to have this testing for further evaluation of pelvic floor dysfunction. 

## 2021-07-01 NOTE — Telephone Encounter (Signed)
I called the patient to discuss the results of her most recent anorectal manometry which showed a normal internal anal sphincter resting pressure, with presence of 1 external anal sphincter that was unable to generate and maintain the squeeze with decreased contraction during cough (unclear if relevant as she has constipation).  Also had adequate relaxation of the external anal sphincter during the strain but she was not able to pass the balloon during expulsion testing.  There RAIR was present and without any alterations  The patient unfortunately did not pick up the phone and I left a voice message explaining the findings and the need to proceed with an MR defecography if she is still having significant constipation.  I left her a message stating that if she wants to proceed with this we will refer her to have this testing for further evaluation of pelvic floor dysfunction.

## 2021-07-04 ENCOUNTER — Encounter (INDEPENDENT_AMBULATORY_CARE_PROVIDER_SITE_OTHER): Payer: Self-pay

## 2021-07-04 ENCOUNTER — Telehealth (INDEPENDENT_AMBULATORY_CARE_PROVIDER_SITE_OTHER): Payer: Self-pay | Admitting: Gastroenterology

## 2021-07-04 DIAGNOSIS — K581 Irritable bowel syndrome with constipation: Secondary | ICD-10-CM | POA: Insufficient documentation

## 2021-07-04 NOTE — Telephone Encounter (Signed)
Patient aware of all and wants to proceed she has spoken with Dewayne Hatch whom advised that she would be making the referral.

## 2021-07-04 NOTE — Telephone Encounter (Signed)
Thanks

## 2021-07-04 NOTE — Telephone Encounter (Signed)
PA submitted for referral for defecography

## 2021-07-04 NOTE — Telephone Encounter (Signed)
I called and left a message asked that she please return call. 

## 2021-07-04 NOTE — Telephone Encounter (Signed)
Spoke with the patient regarding the results of her most recent anorectal manometry.  We will proceed with referral to undergo MR defecography at [facility].  Patient understood and agreed.  Ann, can you please set this up?  Thanks,  [name], MD Gastroenterology and Hepatology [facility]

## 2021-07-19 ENCOUNTER — Other Ambulatory Visit (INDEPENDENT_AMBULATORY_CARE_PROVIDER_SITE_OTHER): Payer: Self-pay | Admitting: Gastroenterology

## 2021-07-19 ENCOUNTER — Telehealth (INDEPENDENT_AMBULATORY_CARE_PROVIDER_SITE_OTHER): Payer: Self-pay

## 2021-07-19 DIAGNOSIS — K589 Irritable bowel syndrome without diarrhea: Secondary | ICD-10-CM

## 2021-07-19 MED ORDER — AMITRIPTYLINE HCL 25 MG PO TABS: 25.0000 mg | ORAL_TABLET | Freq: Every day | ORAL | 3 refills | Status: DC

## 2021-07-19 NOTE — Telephone Encounter (Signed)
Patient called today and wants to know if you can increase her amitriptyline 10 mg one QHS. She states this dose is not of any help to her. She states it is supposed to help her sleep and it does not even make her drowsy and is not helping her symptoms. If increased she will need this sent to [facility] [place]. Please advise.

## 2021-07-19 NOTE — Telephone Encounter (Signed)
Will increase the dosage to 25 mg bedtime. Does not need to take the 10 mg anymore. Thanks

## 2021-07-19 NOTE — Telephone Encounter (Signed)
Patient aware.

## 2021-10-05 ENCOUNTER — Telehealth (INDEPENDENT_AMBULATORY_CARE_PROVIDER_SITE_OTHER): Payer: Self-pay

## 2021-10-05 NOTE — Telephone Encounter (Signed)
I spoke with the patient today and discussed the results with her MR defecography which showed presence of a cystocele, presence of a medium anterior rectocele with anterior rectal wall protrusion up to 2.6 cm.  There was adequate response to squeeze and the levator complex was intact..  These findings were consistent with pelvic floor laxity, no definite paradoxical motion of rectovaginal separation with voiding but patient was unable to void the gel.  Due to this, I discussed with the patient the need to refer her to a tertiary center for further management of this.  Hi Ann, can you please refer the patient to [facility] GI department for evaluation by Dr. Ebony Cargo for evaluation of pelvic floor dysfunction and rectocel?Marland Kitchen  Thanks,  [name], MD Gastroenterology and Hepatology [facility]

## 2021-10-05 NOTE — Telephone Encounter (Signed)
Patient called today stating her results from the MR Defecography were in from [facility], which was preformed on 10/03/2021. She would like to know the results. Please advise.

## 2021-10-06 ENCOUNTER — Other Ambulatory Visit (INDEPENDENT_AMBULATORY_CARE_PROVIDER_SITE_OTHER): Payer: Self-pay | Admitting: Gastroenterology

## 2021-10-06 NOTE — Telephone Encounter (Signed)
Referral sent 

## 2021-10-24 ENCOUNTER — Other Ambulatory Visit (INDEPENDENT_AMBULATORY_CARE_PROVIDER_SITE_OTHER): Payer: Self-pay | Admitting: Gastroenterology

## 2021-10-24 ENCOUNTER — Telehealth (INDEPENDENT_AMBULATORY_CARE_PROVIDER_SITE_OTHER): Payer: Self-pay

## 2021-10-24 DIAGNOSIS — R0781 Pleurodynia: Secondary | ICD-10-CM

## 2021-10-24 MED ORDER — DICLOFENAC SODIUM 1 % EX GEL: 2.0000 g | Freq: Three times a day (TID) | CUTANEOUS | 0 refills | Status: DC | PRN

## 2021-10-24 NOTE — Telephone Encounter (Signed)
Spoke to the patient today regarding her symptoms, she reported her pain is located in her coastal region in the right upper quadrant and goes to her back.  She has seen her primary care to schedule an MRI but she has not performed this yet.  She reports the pain gets worse when she bends down and is not present all the time.  It seems that her symptoms are musculoskeletal in nature.  She is currently taking Linzess and MiraLAX on a daily basis.  She is due to be seen by [facility] for pelvic floor dysfunction.  I will prescribe Voltaren cream with no refills and she can also use lidocaine patches for now but I advised her symptoms are likely related to musculoskeletal or neurologic etiology, which will need to be further evaluated with the MRI she is scheduled for.  She will need to follow her PCP for this.

## 2021-10-24 NOTE — Telephone Encounter (Signed)
Patient called stating she stays in constant pain in the RUQ. She has a cream (OTC,does not know the name) she rubs on it and it helps, but she does not know the name of it. She also having bad headaches and nausea and occasional vomiting. She states her balance is off also. She states she had MRI done recently which she states she was told something was loose based off of the MRI. She has another appointment at [facility] in April 2023. She states she is needing something for pain. ( I see she had a MR Defecography preformed on 10/03/2021).

## 2021-12-20 IMAGING — CT CT ABD-PELV W/ CM
2 of 5 series · 16 of 46 positions shown, 18 images · IV contrast (Omnipaque or Isovue)
Comparison: None.

CLINICAL DATA: Abdominal distension and pain for several weeks,
initial encounter

EXAM:
CT ABDOMEN AND PELVIS WITH CONTRAST
TECHNIQUE: Multidetector CT imaging of the abdomen and pelvis was performed
using the standard protocol following bolus administration of
intravenous contrast.
CONTRAST:  100mL OMNIPAQUE IOHEXOL 300 MG/ML  SOLN

[Series 2: axial st · axial · 0.91mm/px · z∈[+701,+1126]mm · 13 of 97 slices shown, 15 images]
[im 6/97  soft-tissue]
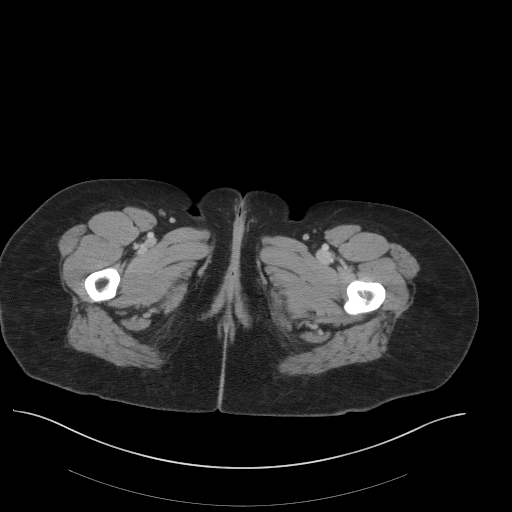
[im 6/97  bone]
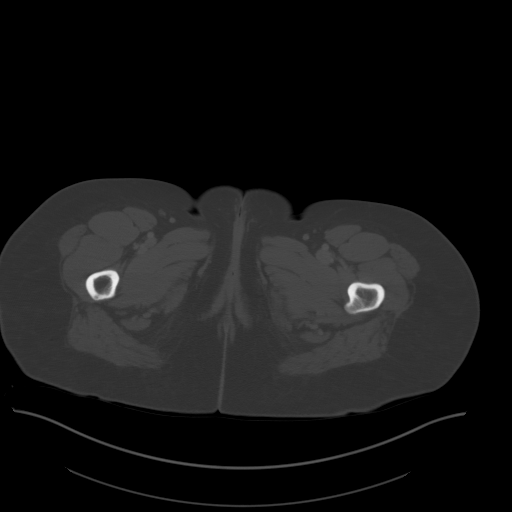
[im 12/97  soft-tissue]
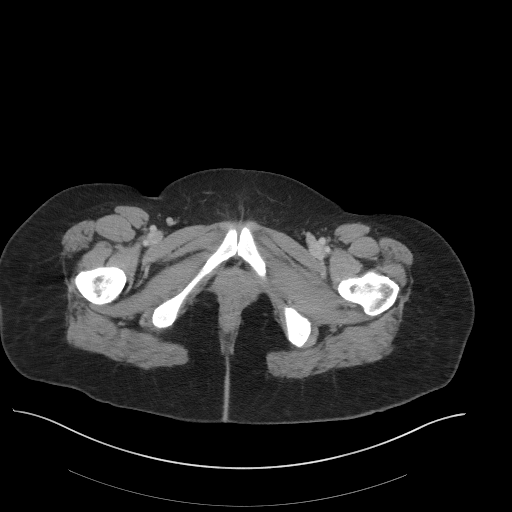
[im 23/97  soft-tissue]
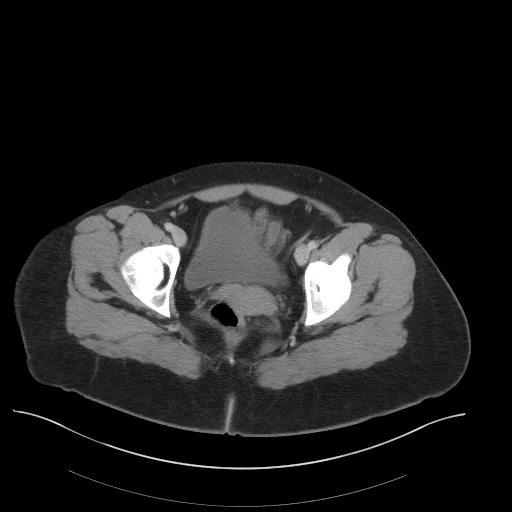
[im 29/97  soft-tissue]
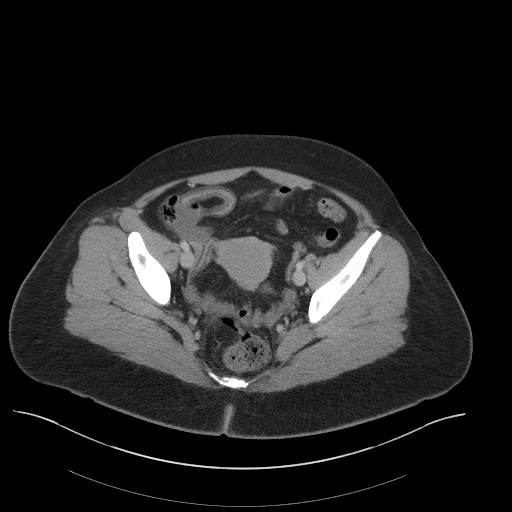
[im 34/97  soft-tissue]
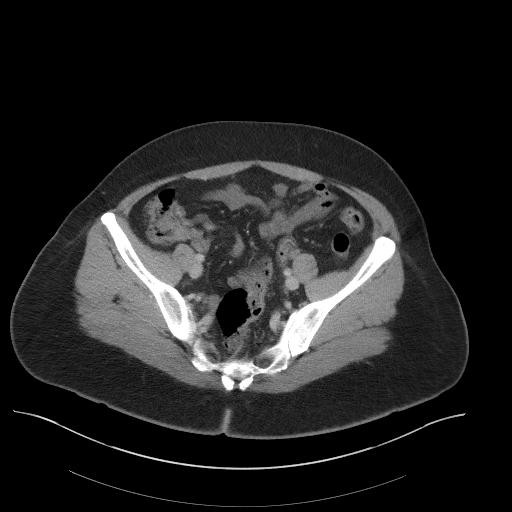
[im 40/97  soft-tissue]
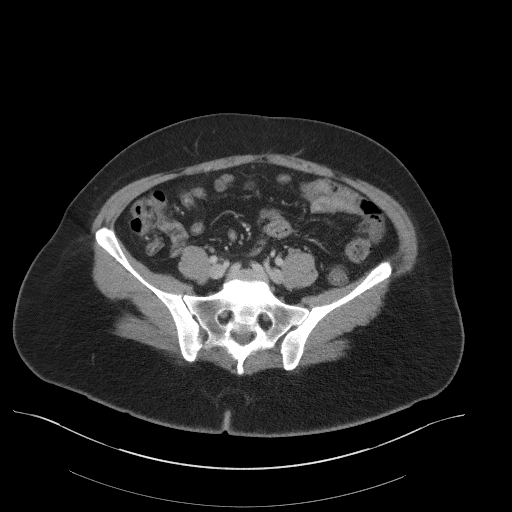
[im 51/97  soft-tissue]
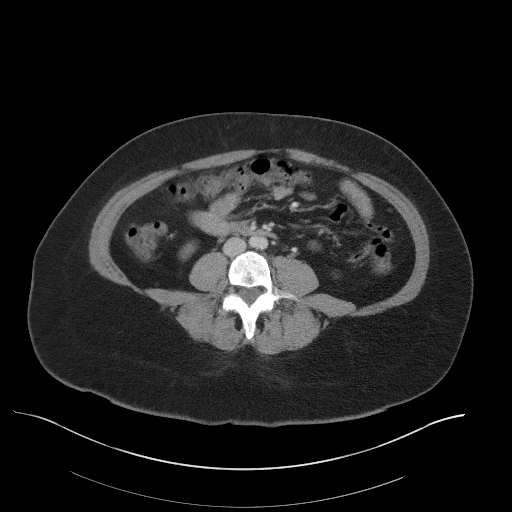
[im 57/97  soft-tissue]
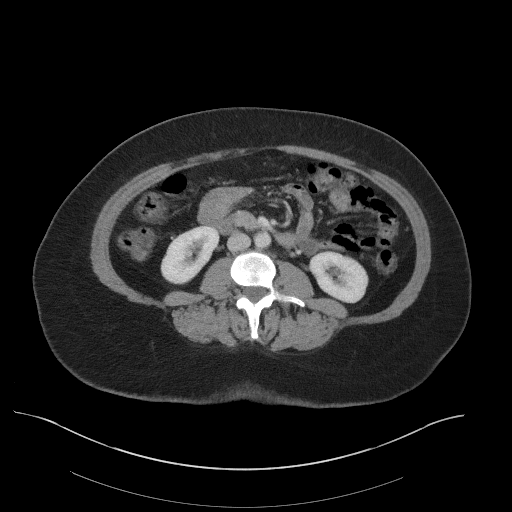
[im 63/97  soft-tissue]
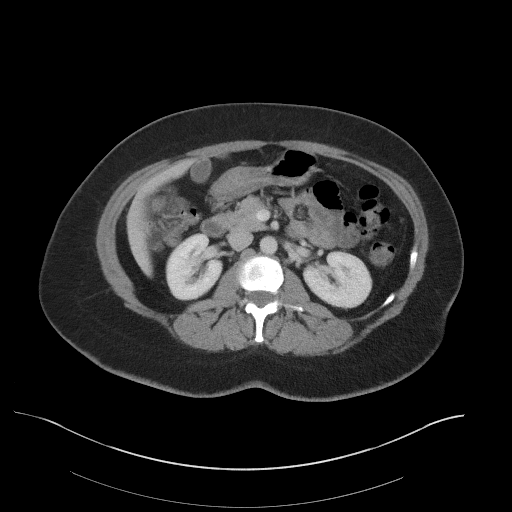
[im 63/97  bone]
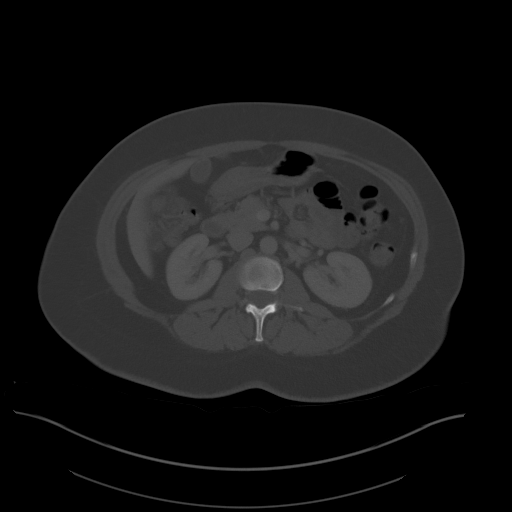
[im 68/97  soft-tissue]
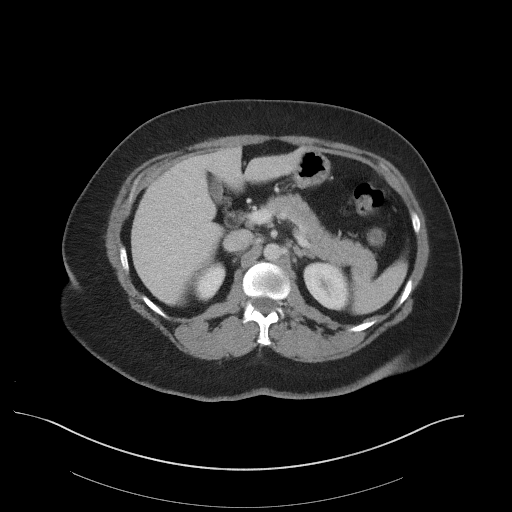
[im 74/97  soft-tissue]
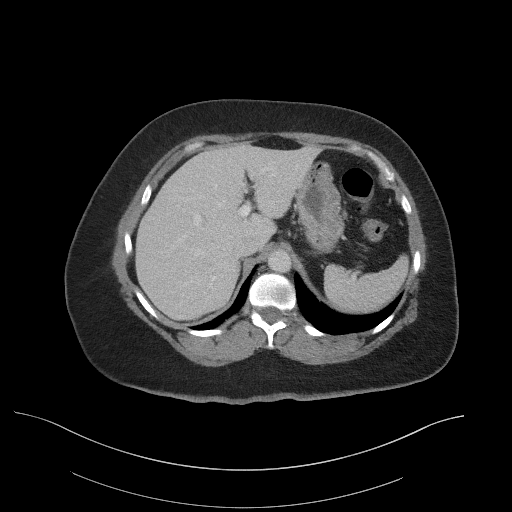
[im 85/97  soft-tissue]
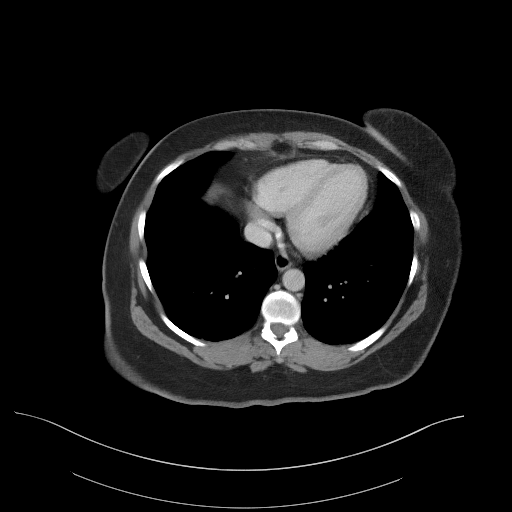
[im 91/97  soft-tissue]
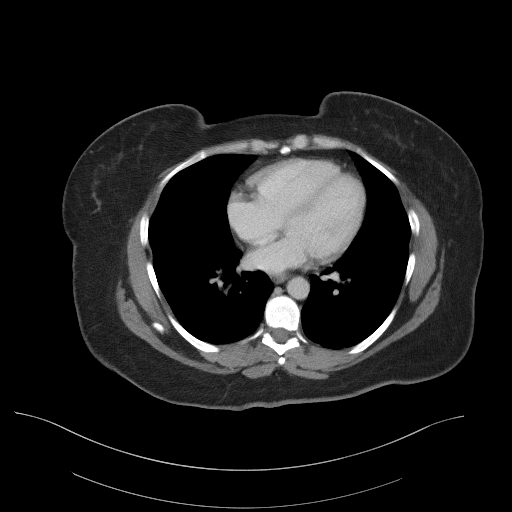

[Series 5: coronal st · coronal · 0.86mm/px · 3 of 112 slices shown]
[im 38/112  soft-tissue]
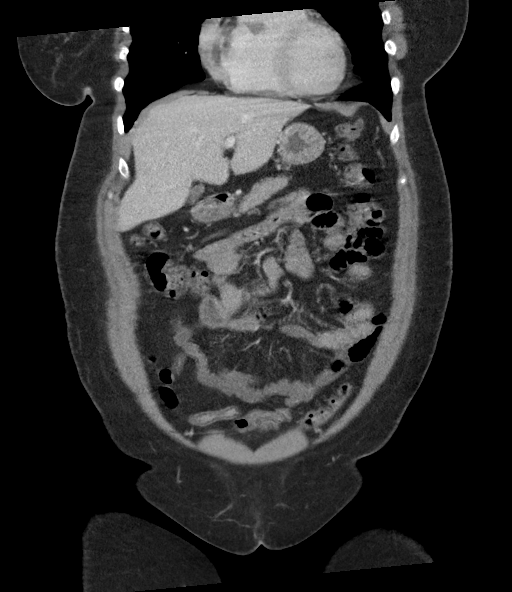
[im 50/112  soft-tissue]
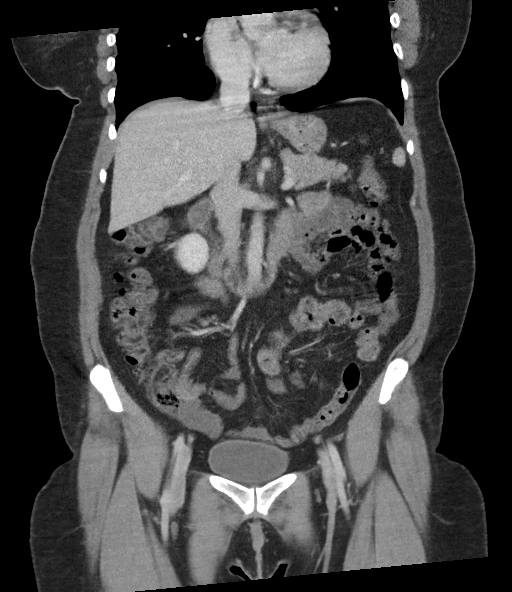
[im 62/112  soft-tissue]
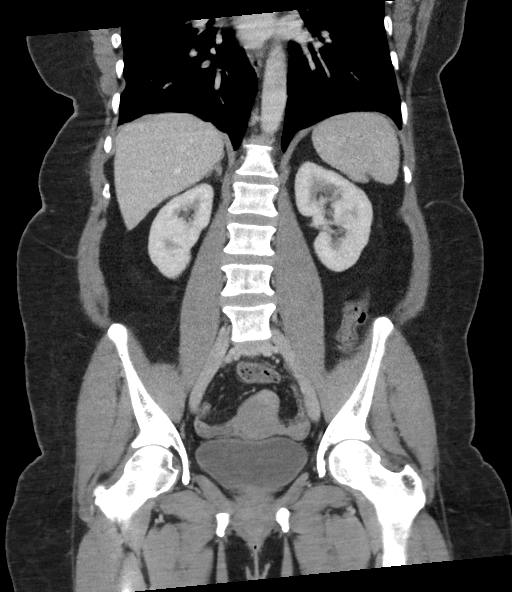

[16 of 46 positions shown; findings below may reference images not displayed]

FINDINGS: Lower chest: No acute abnormality.

Hepatobiliary: No focal liver abnormality is seen. No gallstones,
gallbladder wall thickening, or biliary dilatation.

Pancreas: Unremarkable. No pancreatic ductal dilatation or
surrounding inflammatory changes.

Spleen: Normal in size without focal abnormality.

Adrenals/Urinary Tract: The adrenal glands are within normal limits.
Kidneys are well visualized bilaterally. Normal enhancement pattern
is seen. Normal excretion of contrast is noted bilaterally. No
obstructive changes are noted. No renal calculi are seen. The
bladder is partially distended.

Stomach/Bowel: The appendix is within normal limits. No obstructive
or inflammatory changes of colon are seen. Small bowel and stomach
appear within normal limits.

Vascular/Lymphatic: No significant vascular findings are present. No
enlarged abdominal or pelvic lymph nodes.

Reproductive: Uterus and bilateral adnexa are unremarkable.

Other: No abdominal wall hernia or abnormality. No abdominopelvic
ascites.

Musculoskeletal: Degenerative changes of lumbar spine are noted.
IMPRESSION: No acute abnormality noted.

## 2022-07-06 ENCOUNTER — Encounter (INDEPENDENT_AMBULATORY_CARE_PROVIDER_SITE_OTHER): Payer: Self-pay | Admitting: Gastroenterology

## 2022-07-06 ENCOUNTER — Ambulatory Visit (INDEPENDENT_AMBULATORY_CARE_PROVIDER_SITE_OTHER): Payer: PRIVATE HEALTH INSURANCE | Admitting: Gastroenterology

## 2022-07-06 VITALS — BP 118/83 | HR 103 | Temp 98.9°F | Ht 66.0 in | Wt 196.8 lb

## 2022-07-06 DIAGNOSIS — M6289 Other specified disorders of muscle: Secondary | ICD-10-CM

## 2022-07-06 DIAGNOSIS — R12 Heartburn: Secondary | ICD-10-CM

## 2022-07-06 DIAGNOSIS — K581 Irritable bowel syndrome with constipation: Secondary | ICD-10-CM | POA: Diagnosis not present

## 2022-07-06 MED ORDER — OMEPRAZOLE 40 MG PO CPDR: 40.0000 mg | DELAYED_RELEASE_CAPSULE | Freq: Two times a day (BID) | ORAL | 1 refills | Status: DC

## 2022-07-06 NOTE — Patient Instructions (Addendum)
Try to arrange appointment with uro-GYN and pelvic floor therapist ASAP Continue Linzess 290 mcg qday Increase Miralax to 2 capfuls per day, can take up to 3 capfuls per day depending on number of bowel movements Start omeprazole 40 mg qday Explained presumed etiology of reflux symptoms. Instruction provided in the use of antireflux medication - patient should take medication in the morning 30-45 minutes before eating breakfast. Discussed avoidance of eating within 2 hours of lying down to sleep and benefit of blocks to elevate head of bed. Also, will benefit from avoiding carbonated drinks/sodas or food that has tomatoes, spicy or greasy food. If persistent heartburn, will need to evaluate symptoms further with pH impedance testing on PPI

## 2022-07-06 NOTE — Progress Notes (Signed)
[name], M.D. Gastroenterology & Hepatology [facility] [address] [place], [place] [address]  Primary Care Physician: Chana Bode, DO No address on file  I will communicate my assessment and recommendations to the referring MD via EMR.  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 female with PMH migraine, IBS-C and previous narcotic use, who presents for follow up of heartburn, abdominal pain and constipation.  The patient was last seen on 05/19/2021. At that time, the patient was continued on Elavil and she was scheduled for an a rectal manometric that showed presence of possible pelvic floor dysfunction.  She was continued on Linzess to 90 mcg every day.  She was referred to [facility] for further evaluation.  Patient was seen by Dr. Granville Lewis at [facility] regarding second opinion about her constipation and anorectal manometry with presence of pelvic floor dysfunction.  She was referred to uro-GYN surgery for evaluation of pelvic floor dysfunction and to physical therapy for pelvic floor therapy.  She was also prescribed Amitiza and continue the MiraLAX in the meantime. She was given a trial of Protonix.  Patient reports that she recently reported recurrent episodes of heartburn in her chest. She states this has fluctuated for the last 6 months but this past weekend it was very severe. She states the heartburn did not improve while taking Protonix in the past. She states she is currently off PPI and is having heartburn on a daily basis - states that she feels "heartburn all day long, it does not go away".  States that Elavil has helped with her chronic epigastric pain, but does not help with the heartburn.  Has not been able to follow with pelvic floor therapist as Marilynne Drivers is out of her network, still looking for options. Has not seen  uro-GYN yet. Has a BM every week. Is currently taking Linzess 290 and takes 1 capful of Miralax daily.   The patient denies having any nausea, vomiting, fever, chills, hematochezia, melena, hematemesis, abdominal distention, abdominal pain, diarrhea, jaundice, pruritus. Has lost a few lb as she is eating less than usual due to decreased appetite.  Last EGD:  09/01/2020 1 cm hiatal hernia without any other alterations, biopsies of the small bowel were negative for celiac disease Last Colonoscopy: 09/01/2020 internal hemorrhoids.    Past Medical History: Past Medical History:  Diagnosis Date   Migraines     Past Surgical History: Past Surgical History:  Procedure Laterality Date   BIOPSY  09/01/2020   Procedure: BIOPSY;  Surgeon: Dolores Frame, MD;  [facility];  Service: Gastroenterology;;   COLONOSCOPY WITH PROPOFOL N/A 09/01/2020   Procedure: COLONOSCOPY WITH PROPOFOL;  Surgeon: Dolores Frame, MD;  [facility];  Service: Gastroenterology;  Laterality: N/A;  900   ESOPHAGOGASTRODUODENOSCOPY (EGD) WITH PROPOFOL N/A 09/01/2020   Procedure: ESOPHAGOGASTRODUODENOSCOPY (EGD) WITH PROPOFOL;  Surgeon: Dolores Frame, MD;  [facility];  Service: Gastroenterology;  Laterality: N/A;   TUBAL LIGATION      Family History: Family History  Problem Relation Age of Onset   Diabetes Mother     Social History: Social History   Tobacco Use  Smoking Status Never   Passive exposure: Never  Smokeless Tobacco Never   Social History   Substance and Sexual Activity  Alcohol Use Never   Social History   Substance and Sexual Activity  Drug Use Never    Allergies: No Known Allergies  Medications: Current Outpatient Medications  Medication Sig Dispense Refill   ALPRAZolam (XANAX) 1 MG tablet Take 1 mg by mouth 3 (three) times daily as needed for anxiety.      amitriptyline (ELAVIL) 25 MG tablet Take 1 tablet (25 mg total) by  mouth at bedtime. 90 tablet 3   diclofenac Sodium (VOLTAREN) 1 % GEL Apply 2 g topically every 8 (eight) hours as needed (pain in costal ridge). 2 g 0   dicyclomine (BENTYL) 10 MG capsule Take 1 capsule (10 mg total) by mouth 3 (three) times daily before meals. prn 90 capsule 11   ELDERBERRY PO Take by mouth daily at 6 (six) AM. Two po QD.     ketoconazole (NIZORAL) 2 % cream Apply 1 application topically daily. APPLIED TO FACE     Lactobacillus (PROBIOTIC ACIDOPHILUS PO) Take by mouth daily. 2 Gummies daily.     linaclotide (LINZESS) 290 MCG CAPS capsule Take 1 capsule (290 mcg total) by mouth daily before breakfast. 90 capsule 3   medroxyPROGESTERone (DEPO-PROVERA) 150 MG/ML injection Inject 150 mg into the muscle every 3 (three) months.     Multiple Vitamins-Minerals (MULTIVITAL PO) Take by mouth. 2 gummies daily     ondansetron (ZOFRAN ODT) 4 MG disintegrating tablet Take 1 tablet (4 mg total) by mouth every 8 (eight) hours as needed for nausea or vomiting. 20 tablet 0   No current facility-administered medications for this visit.    Review of Systems: GENERAL: negative for malaise, night sweats HEENT: No changes in hearing or vision, no nose bleeds or other nasal problems. NECK: Negative for lumps, goiter, pain and significant neck swelling RESPIRATORY: Negative for cough, wheezing CARDIOVASCULAR: Negative for chest pain, leg swelling, palpitations, orthopnea GI: SEE HPI MUSCULOSKELETAL: Negative for joint pain or swelling, back pain, and muscle pain. SKIN: Negative for lesions, rash PSYCH: Negative for sleep disturbance, mood disorder and recent psychosocial stressors. HEMATOLOGY Negative for prolonged bleeding, bruising easily, and swollen nodes. ENDOCRINE: Negative for cold or heat intolerance, polyuria, polydipsia and goiter. NEURO: negative for tremor, gait imbalance, syncope and seizures. The remainder of the review of systems is noncontributory.   Physical Exam: BP 118/83  (BP Location: Left Arm, Patient Position: Sitting, Cuff Size: Large)   Pulse (!) 103   Temp 98.9 F (37.2 C) (Oral)   Ht 5\' 6"  (1.676 m)   Wt 196 lb 12.8 oz (89.3 kg)   BMI 31.76 kg/m  GENERAL: The patient is AO x3, in no acute distress. HEENT: Head is normocephalic and atraumatic. EOMI are intact. Mouth is well hydrated and without lesions. NECK: Supple. No masses LUNGS: Clear to auscultation. No presence of rhonchi/wheezing/rales. Adequate chest expansion HEART: RRR, normal s1 and s2. ABDOMEN:mildly tender in LLQ, no guarding, no peritoneal signs, and nondistended. BS +. No masses. EXTREMITIES: Without any cyanosis, clubbing, rash, lesions or edema. NEUROLOGIC: AOx3, no focal motor deficit. SKIN: no jaundice, no rashes  Imaging/Labs: as above  I personally reviewed and interpreted the available labs, imaging and endoscopic files.  Impression and Plan: Heyli Fox is a 46 y.o. female with PMH migraine, IBS-C and previous narcotic use, who presents for follow up of heartburn, abdominal pain and constipation.  Regarding her new onset of heartburn, the patient has presented persistent symptoms despite taking Protonix in the past.  We will try a different higher potency PPI for now, for which I will prescribe her omeprazole 40 mg every day.  I discussed with her that if she has persistent symptoms, we will need to proceed with pH impedance testing on PPI.  Regarding her chronic constipation symptoms, this is possibly related to a combination of pelvic floor dysfunction and IBS-C.  She has had very mild improvement with her dose of Linzess but unfortunately has not been able to follow with the pelvic floor therapist.  Due to this, we will continue her on Linzess and I advised her to increase the dose of MiraLAX but I emphasized about the importance of following with the pelvic floor therapist and uro-GYN.  Overall, her chronic abdominal pain has been better controlled and she needs to  continue on her current dose of Elavil  -Try to arrange appointment with uro-GYN and pelvic floor therapist ASAP -Continue Linzess 290 mcg qday -Continue Elavil 25 mg every night -Increase Miralax to 2 capfuls per day, can take up to 3 capfuls per day depending on number of bowel movements -Start omeprazole 40 mg qday -Explained presumed etiology of reflux symptoms. Instruction provided in the use of antireflux medication - patient should take medication in the morning 30-45 minutes before eating breakfast. Discussed avoidance of eating within 2 hours of lying down to sleep and benefit of blocks to elevate head of bed. Also, will benefit from avoiding carbonated drinks/sodas or food that has tomatoes, spicy or greasy food. -If persistent heartburn, will need to evaluate symptoms further with pH impedance testing on PPI  All questions were answered.      Dolores Frame, MD Gastroenterology and Hepatology [facility]

## 2022-07-10 ENCOUNTER — Other Ambulatory Visit (INDEPENDENT_AMBULATORY_CARE_PROVIDER_SITE_OTHER): Payer: Self-pay | Admitting: Gastroenterology

## 2022-07-10 DIAGNOSIS — K589 Irritable bowel syndrome without diarrhea: Secondary | ICD-10-CM

## 2022-07-10 NOTE — Telephone Encounter (Signed)
Seen 07/06/22. Note states to continue amtriptyline

## 2022-08-02 ENCOUNTER — Other Ambulatory Visit (INDEPENDENT_AMBULATORY_CARE_PROVIDER_SITE_OTHER): Payer: Self-pay | Admitting: Gastroenterology

## 2022-08-02 DIAGNOSIS — K589 Irritable bowel syndrome without diarrhea: Secondary | ICD-10-CM

## 2022-08-02 DIAGNOSIS — K581 Irritable bowel syndrome with constipation: Secondary | ICD-10-CM

## 2022-09-07 ENCOUNTER — Ambulatory Visit (INDEPENDENT_AMBULATORY_CARE_PROVIDER_SITE_OTHER): Payer: Medicaid - Out of State | Admitting: Gastroenterology

## 2022-09-11 ENCOUNTER — Telehealth (INDEPENDENT_AMBULATORY_CARE_PROVIDER_SITE_OTHER): Payer: Self-pay | Admitting: *Deleted

## 2022-09-11 NOTE — Telephone Encounter (Signed)
Fax from [facility]. Linzess approved through 09/11/22 - 09/12/23. Reference # [id number]. Approval letter faxed to pharmacy.

## 2022-10-09 ENCOUNTER — Other Ambulatory Visit (INDEPENDENT_AMBULATORY_CARE_PROVIDER_SITE_OTHER): Payer: Self-pay | Admitting: Gastroenterology

## 2022-10-09 DIAGNOSIS — R0781 Pleurodynia: Secondary | ICD-10-CM

## 2022-10-25 ENCOUNTER — Other Ambulatory Visit (INDEPENDENT_AMBULATORY_CARE_PROVIDER_SITE_OTHER): Payer: Self-pay | Admitting: Gastroenterology

## 2022-10-25 DIAGNOSIS — R12 Heartburn: Secondary | ICD-10-CM

## 2023-06-19 ENCOUNTER — Other Ambulatory Visit (INDEPENDENT_AMBULATORY_CARE_PROVIDER_SITE_OTHER): Payer: Self-pay | Admitting: Gastroenterology

## 2023-06-19 DIAGNOSIS — R0781 Pleurodynia: Secondary | ICD-10-CM

## 2023-06-19 NOTE — Telephone Encounter (Signed)
Will refill this for one month, she needs to ask PCP for further refills

## 2023-07-12 ENCOUNTER — Encounter (INDEPENDENT_AMBULATORY_CARE_PROVIDER_SITE_OTHER): Payer: Self-pay | Admitting: Gastroenterology

## 2023-07-12 ENCOUNTER — Telehealth (INDEPENDENT_AMBULATORY_CARE_PROVIDER_SITE_OTHER): Payer: Self-pay | Admitting: Gastroenterology

## 2023-07-12 ENCOUNTER — Ambulatory Visit (INDEPENDENT_AMBULATORY_CARE_PROVIDER_SITE_OTHER): Payer: Medicaid - Out of State | Admitting: Gastroenterology

## 2023-07-12 VITALS — BP 129/83 | HR 150 | Temp 97.8°F | Ht 66.0 in | Wt 221.8 lb

## 2023-07-12 DIAGNOSIS — R12 Heartburn: Secondary | ICD-10-CM | POA: Diagnosis not present

## 2023-07-12 DIAGNOSIS — M6289 Other specified disorders of muscle: Secondary | ICD-10-CM

## 2023-07-12 DIAGNOSIS — K219 Gastro-esophageal reflux disease without esophagitis: Secondary | ICD-10-CM | POA: Diagnosis not present

## 2023-07-12 DIAGNOSIS — K581 Irritable bowel syndrome with constipation: Secondary | ICD-10-CM | POA: Diagnosis not present

## 2023-07-12 MED ORDER — FAMOTIDINE 40 MG PO TABS
40.0000 mg | ORAL_TABLET | Freq: Every evening | ORAL | 3 refills | Status: DC
Start: 2023-07-12 — End: 2024-07-11

## 2023-07-12 NOTE — Patient Instructions (Addendum)
Continue Linzess 290 mcg qday Start taking Miralax 1 capful every day for one week. If bowel movements do not improve, increase to 2 capfuls every day. If after two weeks there is no improvement, increase to 2 capfuls in AM and one at night. Can decrease intake of Bentyl if not feeling significant improvement of pain Follow up with GYN - ask again about surgical options for pelvic floor dysfunction Take omeprazole 40 mg in the morning and start famotidine 40 mg at bedtime. Take medicines 30-45 minutes before breakfast and supper If persisting symptoms, may consider repeating a CT abdomen

## 2023-07-12 NOTE — Telephone Encounter (Signed)
Patient was advised that [facility] doesn't take [place] - patient was advised she may be responsible for paying for this ofc visit

## 2023-07-12 NOTE — Progress Notes (Signed)
[name], M.D. Gastroenterology & Hepatology [facility] Gastroenterology [address] [place], [place] [address]  Primary Care Physician: Chana Bode, DO No address on file  I will communicate my assessment and recommendations to the referring MD via EMR.  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 female with PMH migraine, IBS-C, pelvic floor dysfunction, GERD, and previous narcotic use, who presents for follow up of heartburn, abdominal pain and constipation.   The patient was last seen on 07/06/2022. At that time, the patient was advised to follow-up with GYN and pelvic floor therapist, also advised to continue Linzess to 90 mcg every day and Elavil 25 mg every night.  Also advised to take MiraLAX regularly.  She was started on omeprazole 40 mg every day.  States that for the last week she has had heartburn after she eats. Tries to take omeprazole 30 minutes before her supper, but even if taking it compliantly, she is still having heartburn. Has tried taking it in the morning time. No dysphagia or odynophagia.   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 States that after her surgery her BM frequency changed, as she is having a BM once or twice a week. She takes Linzess 290 mcg which was making her move her bowels daily. Now is using Linzess and uses suppositories to have a BM. She is taking Bentyl every 8 hours but does not feel any improvement. Is concerned as she feels frequent pain in her abdomen on a daily basis. Pain is similar to her chronic abdominal pain. She is now off Elavil as she did not have any improvement with this.  Notably, the patient has been following with Dr. Bubba Hales at [facility].  Given poor improvement of symptoms with medications she was referred  again to uro-GYN surgery. She reports that she was told by the uro-GYN surgeon that her abnormalities were not severe enough to pursue surgery. I do not have a report of this.  The patient denies having any nausea, vomiting, fever, chills, hematochezia, melena, hematemesis, abdominal distention, diarrhea, jaundice, pruritus or weight loss.  Previous investigations: Anorectal manometry 06/2021-concerning for pelvic floor dysfunction and recommended MRI.  MRI defecography 09/2021-global pelvic floor laxity with descent of all components. Presence of rectocele and cystocele.   Last EGD:  09/01/2020 1 cm hiatal hernia without any other alterations, biopsies of the small bowel were negative for celiac disease Last Colonoscopy: 09/01/2020 internal hemorrhoids.    Past Medical History: Past Medical History:  Diagnosis Date   Migraines     Past Surgical History: Past Surgical History:  Procedure Laterality Date   BIOPSY  09/01/2020   Procedure: BIOPSY;  Surgeon: Dolores Frame, MD;  [facility];  Service: Gastroenterology;;   COLONOSCOPY WITH PROPOFOL N/A 09/01/2020   Procedure: COLONOSCOPY WITH PROPOFOL;  Surgeon: Dolores Frame, MD;  [facility];  Service: Gastroenterology;  Laterality: N/A;  900   ESOPHAGOGASTRODUODENOSCOPY (EGD) WITH PROPOFOL N/A 09/01/2020   Procedure: ESOPHAGOGASTRODUODENOSCOPY (EGD) WITH PROPOFOL;  Surgeon: Dolores Frame, MD;  [facility];  Service: Gastroenterology;  Laterality: N/A;   TUBAL LIGATION      Family History: Family History  Problem Relation Age of Onset   Diabetes Mother     Social History: Social History   Tobacco Use  Smoking Status  Never   Passive exposure: Never  Smokeless Tobacco Never   Social History   Substance and Sexual Activity  Alcohol Use Never   Social History   Substance and Sexual Activity  Drug Use Never    Allergies: Allergies  Allergen Reactions    Amitriptyline Rash    Diffuse rash/open sores    Medications: Current Outpatient Medications  Medication Sig Dispense Refill   ALPRAZolam (XANAX) 1 MG tablet Take 1 mg by mouth 3 (three) times daily as needed for anxiety.      diclofenac Sodium (VOLTAREN) 1 % GEL APPLY TWO (2) G TOPICALLY EVERY 8 HOURS AS NEEDED FOR PAIN 100 g 0   dicyclomine (BENTYL) 10 MG capsule Take 1 capsule (10 mg total) by mouth 3 (three) times daily before meals. prn 90 capsule 11   ELDERBERRY PO Take by mouth daily at 6 (six) AM. Two po QD.     ketoconazole (NIZORAL) 2 % cream Apply 1 application topically daily. APPLIED TO FACE     Lactobacillus (PROBIOTIC ACIDOPHILUS PO) Take by mouth daily. 2 Gummies daily.     LINZESS 290 MCG CAPS capsule TAKE 1 CAPSULE BY MOUTH DAILY BEFORE BREAKFAST. 90 capsule 3   medroxyPROGESTERone (DEPO-PROVERA) 150 MG/ML injection Inject 150 mg into the muscle every 3 (three) months.     Multiple Vitamins-Minerals (MULTIVITAL PO) Take by mouth. 2 gummies daily     omeprazole (PRILOSEC) 40 MG capsule TAKE 1 CAPSULE BY MOUTH TWICE A DAY 180 capsule 1   ondansetron (ZOFRAN ODT) 4 MG disintegrating tablet Take 1 tablet (4 mg total) by mouth every 8 (eight) hours as needed for nausea or vomiting. 20 tablet 0   No current facility-administered medications for this visit.    Review of Systems: GENERAL: negative for malaise, night sweats HEENT: No changes in hearing or vision, no nose bleeds or other nasal problems. NECK: Negative for lumps, goiter, pain and significant neck swelling RESPIRATORY: Negative for cough, wheezing CARDIOVASCULAR: Negative for chest pain, leg swelling, palpitations, orthopnea GI: SEE HPI MUSCULOSKELETAL: Negative for joint pain or swelling, back pain, and muscle pain. SKIN: Negative for lesions, rash PSYCH: Negative for sleep disturbance, mood disorder and recent psychosocial stressors. HEMATOLOGY Negative for prolonged bleeding, bruising easily, and swollen  nodes. ENDOCRINE: Negative for cold or heat intolerance, polyuria, polydipsia and goiter. NEURO: negative for tremor, gait imbalance, syncope and seizures. The remainder of the review of systems is noncontributory.   Physical Exam: BP 129/83 (BP Location: Left Arm, Patient Position: Sitting, Cuff Size: Large)   Pulse (!) 150   Temp 97.8 F (36.6 C) (Temporal)   Ht 5\' 6"  (1.676 m)   Wt 221 lb 12.8 oz (100.6 kg)   BMI 35.80 kg/m  GENERAL: The patient is AO x3, in no acute distress. HEENT: Head is normocephalic and atraumatic. EOMI are intact. Mouth is well hydrated and without lesions. NECK: Supple. No masses LUNGS: Clear to auscultation. No presence of rhonchi/wheezing/rales. Adequate chest expansion HEART: RRR, normal s1 and s2. ABDOMEN: Soft, nontender, no guarding, no peritoneal signs, and nondistended. BS +. No masses. EXTREMITIES: Without any cyanosis, clubbing, rash, lesions or edema. NEUROLOGIC: AOx3, no focal motor deficit. SKIN: no jaundice, no rashes  Imaging/Labs: as above  I personally reviewed and interpreted the available labs, imaging and endoscopic files.  Impression and Plan: Elizabeth Fox is a 47 y.o. female with PMH migraine, IBS-C, pelvic floor dysfunction, GERD, and previous narcotic use, who presents for follow up of heartburn, abdominal pain and  constipation. Patient has had exacerbation of her defecatory symptoms after she underwent GYN surgery recently. Unclear how much of her symptoms are related to her pelvic floor dysfunction and/or IBS-C. She should discuss her symptoms and options again with uro-GYN, patient understood this. Will attempt to optimize her bowel regimen by adding Miralax daily to her Linzess dose. Hopefully having more regular Bms will improve her abdominal pain. If there is no improvement, will need to consider repeat a CT abdomen and pelvis with IV contrast.  Finally, her heartburn has worsened recently despite taking PPI compliantly. Had  relatively recent EGD with negative findings. Will add famotidine PM to regimen. If not improving, will repeat EGD.  - Continue Linzess 290 mcg qday - Start taking Miralax 1 capful every day for one week. If bowel movements do not improve, increase to 2 capfuls every day. If after two weeks there is no improvement, increase to 2 capfuls in AM and one at night. - Can decrease intake of Bentyl if not feeling significant improvement of pain - Follow up with GYN - patient should ask again about surgical options for pelvic floor dysfunction - Take omeprazole 40 mg in the morning and start famotidine 40 mg at bedtime. Take medicines 30-45 minutes before breakfast and supper - If persisting symptoms, may consider repeating a CT abdomen   All questions were answered.      [name], MD Gastroenterology and Hepatology [facility] Gastroenterology

## 2023-07-16 ENCOUNTER — Other Ambulatory Visit (INDEPENDENT_AMBULATORY_CARE_PROVIDER_SITE_OTHER): Payer: Self-pay | Admitting: Gastroenterology

## 2023-07-16 DIAGNOSIS — K581 Irritable bowel syndrome with constipation: Secondary | ICD-10-CM

## 2023-07-25 ENCOUNTER — Other Ambulatory Visit (INDEPENDENT_AMBULATORY_CARE_PROVIDER_SITE_OTHER): Payer: Self-pay | Admitting: Gastroenterology

## 2023-07-25 DIAGNOSIS — R0781 Pleurodynia: Secondary | ICD-10-CM

## 2023-07-25 NOTE — Telephone Encounter (Signed)
 Last OV 07/12/23

## 2023-07-25 NOTE — Telephone Encounter (Signed)
She will need to ask for this refills to her PCP, will refill once only

## 2023-07-26 NOTE — Telephone Encounter (Signed)
Left message to return call 

## 2023-07-27 NOTE — Telephone Encounter (Signed)
Pt notified this was refilled but future refills should come from pcp. She verbalized understanding

## 2023-08-28 ENCOUNTER — Other Ambulatory Visit (INDEPENDENT_AMBULATORY_CARE_PROVIDER_SITE_OTHER): Payer: Self-pay | Admitting: Gastroenterology

## 2023-08-28 DIAGNOSIS — R0781 Pleurodynia: Secondary | ICD-10-CM

## 2023-08-28 NOTE — Telephone Encounter (Signed)
 This should be prescribed by her PCP

## 2023-11-13 ENCOUNTER — Ambulatory Visit (INDEPENDENT_AMBULATORY_CARE_PROVIDER_SITE_OTHER): Payer: PRIVATE HEALTH INSURANCE | Admitting: Gastroenterology

## 2023-11-14 ENCOUNTER — Other Ambulatory Visit (INDEPENDENT_AMBULATORY_CARE_PROVIDER_SITE_OTHER): Payer: Self-pay | Admitting: Gastroenterology

## 2023-11-14 DIAGNOSIS — R12 Heartburn: Secondary | ICD-10-CM

## 2023-11-14 NOTE — Telephone Encounter (Signed)
Per last office visit.   Continue Linzess 290 mcg qday Start taking Miralax 1 capful every day for one week. If bowel movements do not improve, increase to 2 capfuls every day. If after two weeks there is no improvement, increase to 2 capfuls in AM and one at night. Can decrease intake of Bentyl if not feeling significant improvement of pain Follow up with GYN - ask again about surgical options for pelvic floor dysfunction Take omeprazole 40 mg in the morning and start famotidine 40 mg at bedtime. Take medicines 30-45 minutes before breakfast and supper If persisting symptoms, may consider repeating a CT abdomen

## 2024-01-29 ENCOUNTER — Telehealth (INDEPENDENT_AMBULATORY_CARE_PROVIDER_SITE_OTHER): Payer: Self-pay

## 2024-01-29 NOTE — Telephone Encounter (Signed)
 01/29/2024 Toribio FORTUNE [address] [place], [place] [address]    RE: BOBBIEJO ISHIKAWA Date of Birth: 1976-01-30 ID #: [id number] Reference ID #: [id number] Requesting Provider: Toribio FORTUNE  Dear LINDLE BERRY: [facility] was approved by the Department of Medical Assistance Services [facility]) to provide healthcare services for you. We have approved the following service(s): Service(s) requested: Linzess  OR CAPS Date(s) of service: 01/29/2024 - 07/28/2024 Approved from 01/29/2024 thru 07/28/2024 This decision was based on your coverage. This approval is not a promise to pay if you do not have active coverage or benefits for this service(s). Have questions? We're here Monday through Friday from 8 a.m. to 8 p.m. local time at [phone] (TTY: 711). Sincerely, Pharmacy Prior Authorization Department [facility]

## 2024-07-11 ENCOUNTER — Other Ambulatory Visit (INDEPENDENT_AMBULATORY_CARE_PROVIDER_SITE_OTHER): Payer: Self-pay | Admitting: Gastroenterology

## 2024-07-11 DIAGNOSIS — R12 Heartburn: Secondary | ICD-10-CM

## 2024-07-11 NOTE — Telephone Encounter (Signed)
 Patient needs appointment for further refills not seen since July 2024 and no scheduled future appointment here. Please have patient reach out to the office  at [phone] to schedule a follow up and prevent delay in medication.

## 2024-08-02 ENCOUNTER — Other Ambulatory Visit (INDEPENDENT_AMBULATORY_CARE_PROVIDER_SITE_OTHER): Payer: Self-pay | Admitting: Gastroenterology

## 2024-08-02 DIAGNOSIS — R12 Heartburn: Secondary | ICD-10-CM

## 2024-08-04 NOTE — Telephone Encounter (Signed)
 Needs office visit, has not been seen since 06/2023

## 2024-08-07 ENCOUNTER — Other Ambulatory Visit (INDEPENDENT_AMBULATORY_CARE_PROVIDER_SITE_OTHER): Payer: Self-pay | Admitting: Gastroenterology

## 2024-08-07 DIAGNOSIS — R12 Heartburn: Secondary | ICD-10-CM

## 2024-08-07 NOTE — Telephone Encounter (Signed)
 Needs office visit last seen 07/12/2023.

## 2024-08-13 ENCOUNTER — Other Ambulatory Visit (INDEPENDENT_AMBULATORY_CARE_PROVIDER_SITE_OTHER): Payer: Self-pay | Admitting: Gastroenterology

## 2024-08-13 DIAGNOSIS — R12 Heartburn: Secondary | ICD-10-CM

## 2024-08-14 NOTE — Telephone Encounter (Signed)
 LAST SEEN 07/12/2023. NEEDS OFFICE VISIT.

## 2024-10-02 ENCOUNTER — Other Ambulatory Visit (INDEPENDENT_AMBULATORY_CARE_PROVIDER_SITE_OTHER): Payer: Self-pay | Admitting: Gastroenterology

## 2024-10-02 DIAGNOSIS — R12 Heartburn: Secondary | ICD-10-CM

## 2024-10-29 ENCOUNTER — Encounter (INDEPENDENT_AMBULATORY_CARE_PROVIDER_SITE_OTHER): Payer: Self-pay | Admitting: Gastroenterology

## 2024-11-27 ENCOUNTER — Other Ambulatory Visit (INDEPENDENT_AMBULATORY_CARE_PROVIDER_SITE_OTHER): Payer: Self-pay | Admitting: Gastroenterology

## 2024-11-27 DIAGNOSIS — R12 Heartburn: Secondary | ICD-10-CM
